# Patient Record
Sex: Female | Born: 1935 | Race: White | Hispanic: No | Smoking: Never smoker
Health system: Southern US, Community
[De-identification: ages and names within clinical notes are randomized; demographics above are authoritative.]

## PROBLEM LIST (undated history)

## (undated) DIAGNOSIS — M1990 Unspecified osteoarthritis, unspecified site: Secondary | ICD-10-CM

## (undated) DIAGNOSIS — B029 Zoster without complications: Secondary | ICD-10-CM

## (undated) DIAGNOSIS — M19049 Primary osteoarthritis, unspecified hand: Secondary | ICD-10-CM

## (undated) DIAGNOSIS — Z85828 Personal history of other malignant neoplasm of skin: Secondary | ICD-10-CM

## (undated) DIAGNOSIS — J309 Allergic rhinitis, unspecified: Secondary | ICD-10-CM

## (undated) DIAGNOSIS — R490 Dysphonia: Secondary | ICD-10-CM

## (undated) DIAGNOSIS — R011 Cardiac murmur, unspecified: Secondary | ICD-10-CM

## (undated) DIAGNOSIS — K5790 Diverticulosis of intestine, part unspecified, without perforation or abscess without bleeding: Secondary | ICD-10-CM

## (undated) DIAGNOSIS — M810 Age-related osteoporosis without current pathological fracture: Secondary | ICD-10-CM

## (undated) DIAGNOSIS — E785 Hyperlipidemia, unspecified: Secondary | ICD-10-CM

## (undated) DIAGNOSIS — K5730 Diverticulosis of large intestine without perforation or abscess without bleeding: Secondary | ICD-10-CM

## (undated) DIAGNOSIS — C4490 Unspecified malignant neoplasm of skin, unspecified: Secondary | ICD-10-CM

## (undated) HISTORY — PX: MOHS SURGERY: SUR867

## (undated) HISTORY — DX: Zoster without complications: B02.9

## (undated) HISTORY — DX: Diverticulosis of large intestine without perforation or abscess without bleeding: K57.30

## (undated) HISTORY — DX: Hyperlipidemia, unspecified: E78.5

## (undated) HISTORY — DX: Personal history of other malignant neoplasm of skin: Z85.828

## (undated) HISTORY — PX: CATARACT EXTRACTION, BILATERAL: SHX1313

## (undated) HISTORY — DX: Unspecified malignant neoplasm of skin, unspecified: C44.90

## (undated) HISTORY — DX: Age-related osteoporosis without current pathological fracture: M81.0

## (undated) HISTORY — DX: Primary osteoarthritis, unspecified hand: M19.049

## (undated) HISTORY — DX: Allergic rhinitis, unspecified: J30.9

## (undated) HISTORY — DX: Diverticulosis of intestine, part unspecified, without perforation or abscess without bleeding: K57.90

## (undated) HISTORY — DX: Unspecified osteoarthritis, unspecified site: M19.90

## (undated) HISTORY — DX: Dysphonia: R49.0

## (undated) HISTORY — DX: Cardiac murmur, unspecified: R01.1

---

## 1978-09-01 DIAGNOSIS — C4491 Basal cell carcinoma of skin, unspecified: Secondary | ICD-10-CM

## 1978-09-01 HISTORY — DX: Basal cell carcinoma of skin, unspecified: C44.91

## 1988-06-23 HISTORY — PX: VAGINAL HYSTERECTOMY: SUR661

## 1998-02-22 ENCOUNTER — Ambulatory Visit (HOSPITAL_COMMUNITY): Admission: RE | Admit: 1998-02-22 | Discharge: 1998-02-22 | Payer: Self-pay | Admitting: Family Medicine

## 1998-11-22 ENCOUNTER — Other Ambulatory Visit: Admission: RE | Admit: 1998-11-22 | Discharge: 1998-11-22 | Payer: Self-pay | Admitting: *Deleted

## 1999-03-12 ENCOUNTER — Ambulatory Visit (HOSPITAL_COMMUNITY): Admission: RE | Admit: 1999-03-12 | Discharge: 1999-03-12 | Payer: Self-pay | Admitting: *Deleted

## 1999-03-12 ENCOUNTER — Encounter: Payer: Self-pay | Admitting: *Deleted

## 1999-05-24 ENCOUNTER — Encounter: Admission: RE | Admit: 1999-05-24 | Discharge: 1999-05-24 | Payer: Self-pay | Admitting: Family Medicine

## 1999-05-24 ENCOUNTER — Encounter: Payer: Self-pay | Admitting: Family Medicine

## 2000-01-14 ENCOUNTER — Other Ambulatory Visit: Admission: RE | Admit: 2000-01-14 | Discharge: 2000-01-14 | Payer: Self-pay | Admitting: *Deleted

## 2000-05-11 ENCOUNTER — Encounter: Payer: Self-pay | Admitting: *Deleted

## 2000-05-11 ENCOUNTER — Ambulatory Visit (HOSPITAL_COMMUNITY): Admission: RE | Admit: 2000-05-11 | Discharge: 2000-05-11 | Payer: Self-pay | Admitting: *Deleted

## 2001-02-23 ENCOUNTER — Other Ambulatory Visit: Admission: RE | Admit: 2001-02-23 | Discharge: 2001-02-23 | Payer: Self-pay | Admitting: *Deleted

## 2001-05-13 ENCOUNTER — Ambulatory Visit (HOSPITAL_COMMUNITY): Admission: RE | Admit: 2001-05-13 | Discharge: 2001-05-13 | Payer: Self-pay | Admitting: *Deleted

## 2001-05-13 ENCOUNTER — Encounter: Payer: Self-pay | Admitting: *Deleted

## 2001-05-19 ENCOUNTER — Encounter: Admission: RE | Admit: 2001-05-19 | Discharge: 2001-05-19 | Payer: Self-pay | Admitting: *Deleted

## 2001-05-19 ENCOUNTER — Encounter: Payer: Self-pay | Admitting: *Deleted

## 2002-07-28 ENCOUNTER — Ambulatory Visit (HOSPITAL_COMMUNITY): Admission: RE | Admit: 2002-07-28 | Discharge: 2002-07-28 | Payer: Self-pay | Admitting: Internal Medicine

## 2002-07-28 ENCOUNTER — Encounter: Payer: Self-pay | Admitting: Internal Medicine

## 2003-07-31 ENCOUNTER — Ambulatory Visit (HOSPITAL_COMMUNITY): Admission: RE | Admit: 2003-07-31 | Discharge: 2003-07-31 | Payer: Self-pay | Admitting: Internal Medicine

## 2004-02-07 ENCOUNTER — Encounter: Admission: RE | Admit: 2004-02-07 | Discharge: 2004-02-07 | Payer: Self-pay | Admitting: Internal Medicine

## 2005-03-04 ENCOUNTER — Ambulatory Visit (HOSPITAL_COMMUNITY): Admission: RE | Admit: 2005-03-04 | Discharge: 2005-03-04 | Payer: Self-pay | Admitting: Internal Medicine

## 2005-04-18 ENCOUNTER — Ambulatory Visit: Payer: Self-pay | Admitting: Internal Medicine

## 2005-04-21 ENCOUNTER — Ambulatory Visit: Payer: Self-pay | Admitting: Internal Medicine

## 2005-05-30 ENCOUNTER — Ambulatory Visit: Payer: Self-pay | Admitting: Family Medicine

## 2006-03-26 ENCOUNTER — Ambulatory Visit (HOSPITAL_COMMUNITY): Admission: RE | Admit: 2006-03-26 | Discharge: 2006-03-26 | Payer: Self-pay | Admitting: Internal Medicine

## 2006-05-25 ENCOUNTER — Ambulatory Visit: Payer: Self-pay | Admitting: Internal Medicine

## 2006-08-01 ENCOUNTER — Ambulatory Visit: Payer: Self-pay | Admitting: Family Medicine

## 2006-09-01 ENCOUNTER — Ambulatory Visit: Payer: Self-pay | Admitting: Internal Medicine

## 2006-09-01 LAB — CONVERTED CEMR LAB
ALT: 19 units/L (ref 0–40)
Alkaline Phosphatase: 67 units/L (ref 39–117)
BUN: 21 mg/dL (ref 6–23)
Basophils Relative: 0.3 % (ref 0.0–1.0)
Bilirubin, Direct: 0.1 mg/dL (ref 0.0–0.3)
CO2: 30 meq/L (ref 19–32)
Cholesterol: 243 mg/dL (ref 0–200)
Direct LDL: 139.4 mg/dL
Eosinophils Relative: 1.4 % (ref 0.0–5.0)
GFR calc non Af Amer: 88 mL/min
Glucose, Bld: 94 mg/dL (ref 70–99)
HCT: 38.9 % (ref 36.0–46.0)
Hemoglobin: 13.2 g/dL (ref 12.0–15.0)
Ketones, ur: NEGATIVE mg/dL
MCHC: 34 g/dL (ref 30.0–36.0)
Monocytes Relative: 9 % (ref 3.0–11.0)
Neutrophils Relative %: 58.5 % (ref 43.0–77.0)
RBC: 4.22 M/uL (ref 3.87–5.11)
RDW: 14.2 % (ref 11.5–14.6)
Specific Gravity, Urine: 1.01 (ref 1.000–1.03)
TSH: 0.96 microintl units/mL (ref 0.35–5.50)
Total Protein, Urine: NEGATIVE mg/dL
Urine Glucose: NEGATIVE mg/dL
Vit D, 1,25-Dihydroxy: 53 (ref 20–57)
pH: 6 (ref 5.0–8.0)

## 2007-01-22 ENCOUNTER — Ambulatory Visit: Payer: Self-pay | Admitting: Internal Medicine

## 2007-01-23 DIAGNOSIS — K5730 Diverticulosis of large intestine without perforation or abscess without bleeding: Secondary | ICD-10-CM

## 2007-01-23 DIAGNOSIS — Z85828 Personal history of other malignant neoplasm of skin: Secondary | ICD-10-CM

## 2007-01-23 DIAGNOSIS — M19049 Primary osteoarthritis, unspecified hand: Secondary | ICD-10-CM

## 2007-01-23 DIAGNOSIS — Z8679 Personal history of other diseases of the circulatory system: Secondary | ICD-10-CM | POA: Insufficient documentation

## 2007-01-23 DIAGNOSIS — E785 Hyperlipidemia, unspecified: Secondary | ICD-10-CM

## 2007-01-23 HISTORY — DX: Primary osteoarthritis, unspecified hand: M19.049

## 2007-01-23 HISTORY — DX: Hyperlipidemia, unspecified: E78.5

## 2007-01-23 HISTORY — DX: Personal history of other malignant neoplasm of skin: Z85.828

## 2007-01-23 HISTORY — DX: Diverticulosis of large intestine without perforation or abscess without bleeding: K57.30

## 2007-02-08 ENCOUNTER — Ambulatory Visit: Payer: Self-pay | Admitting: Internal Medicine

## 2007-02-08 LAB — CONVERTED CEMR LAB
Alkaline Phosphatase: 69 units/L (ref 39–117)
BUN: 13 mg/dL (ref 6–23)
Basophils Absolute: 0.5 10*3/uL — ABNORMAL HIGH (ref 0.0–0.1)
Bilirubin, Direct: 0.1 mg/dL (ref 0.0–0.3)
Chloride: 101 meq/L (ref 96–112)
Creatinine, Ser: 0.8 mg/dL (ref 0.4–1.2)
GFR calc non Af Amer: 76 mL/min
Glucose, Bld: 105 mg/dL — ABNORMAL HIGH (ref 70–99)
Hemoglobin: 14.3 g/dL (ref 12.0–15.0)
Lymphocytes Relative: 13.1 % (ref 12.0–46.0)
Monocytes Relative: 5.5 % (ref 3.0–11.0)
Neutrophils Relative %: 76.5 % (ref 43.0–77.0)
Platelets: 272 10*3/uL (ref 150–400)
Total Protein: 7 g/dL (ref 6.0–8.3)
WBC: 11.2 10*3/uL — ABNORMAL HIGH (ref 4.5–10.5)

## 2007-03-11 ENCOUNTER — Ambulatory Visit: Payer: Self-pay | Admitting: Internal Medicine

## 2007-05-10 ENCOUNTER — Ambulatory Visit: Payer: Self-pay | Admitting: Internal Medicine

## 2007-05-17 ENCOUNTER — Ambulatory Visit: Payer: Self-pay | Admitting: Internal Medicine

## 2007-05-17 LAB — PULMONARY FUNCTION TEST

## 2007-06-01 ENCOUNTER — Ambulatory Visit (HOSPITAL_COMMUNITY): Admission: RE | Admit: 2007-06-01 | Discharge: 2007-06-01 | Payer: Self-pay | Admitting: Internal Medicine

## 2007-06-14 ENCOUNTER — Ambulatory Visit: Payer: Self-pay | Admitting: Family Medicine

## 2007-06-14 ENCOUNTER — Encounter: Payer: Self-pay | Admitting: Internal Medicine

## 2007-08-09 ENCOUNTER — Ambulatory Visit: Payer: Self-pay | Admitting: Internal Medicine

## 2007-08-09 DIAGNOSIS — J40 Bronchitis, not specified as acute or chronic: Secondary | ICD-10-CM | POA: Insufficient documentation

## 2007-09-28 ENCOUNTER — Telehealth: Payer: Self-pay | Admitting: Internal Medicine

## 2007-10-29 ENCOUNTER — Ambulatory Visit: Payer: Self-pay | Admitting: Internal Medicine

## 2007-10-29 DIAGNOSIS — J309 Allergic rhinitis, unspecified: Secondary | ICD-10-CM

## 2007-10-29 DIAGNOSIS — M810 Age-related osteoporosis without current pathological fracture: Secondary | ICD-10-CM

## 2007-10-29 HISTORY — DX: Age-related osteoporosis without current pathological fracture: M81.0

## 2007-10-29 HISTORY — DX: Allergic rhinitis, unspecified: J30.9

## 2007-11-05 ENCOUNTER — Telehealth: Payer: Self-pay | Admitting: Internal Medicine

## 2008-02-15 ENCOUNTER — Ambulatory Visit: Payer: Self-pay | Admitting: Internal Medicine

## 2008-02-18 LAB — CONVERTED CEMR LAB
ALT: 16 units/L (ref 0–35)
AST: 46 units/L — ABNORMAL HIGH (ref 0–37)
Bilirubin, Direct: 0.2 mg/dL (ref 0.0–0.3)
Total CHOL/HDL Ratio: 3.1

## 2008-03-16 ENCOUNTER — Telehealth (INDEPENDENT_AMBULATORY_CARE_PROVIDER_SITE_OTHER): Payer: Self-pay | Admitting: *Deleted

## 2008-03-23 ENCOUNTER — Encounter: Payer: Self-pay | Admitting: Internal Medicine

## 2008-04-12 ENCOUNTER — Ambulatory Visit: Payer: Self-pay | Admitting: Internal Medicine

## 2008-04-12 DIAGNOSIS — B029 Zoster without complications: Secondary | ICD-10-CM | POA: Insufficient documentation

## 2008-04-17 ENCOUNTER — Telehealth (INDEPENDENT_AMBULATORY_CARE_PROVIDER_SITE_OTHER): Payer: Self-pay | Admitting: *Deleted

## 2008-04-24 ENCOUNTER — Telehealth (INDEPENDENT_AMBULATORY_CARE_PROVIDER_SITE_OTHER): Payer: Self-pay | Admitting: *Deleted

## 2008-05-02 ENCOUNTER — Telehealth (INDEPENDENT_AMBULATORY_CARE_PROVIDER_SITE_OTHER): Payer: Self-pay | Admitting: *Deleted

## 2008-05-10 ENCOUNTER — Ambulatory Visit: Payer: Self-pay | Admitting: Internal Medicine

## 2008-05-10 DIAGNOSIS — H538 Other visual disturbances: Secondary | ICD-10-CM

## 2008-05-12 ENCOUNTER — Ambulatory Visit: Payer: Self-pay | Admitting: Internal Medicine

## 2008-05-29 ENCOUNTER — Ambulatory Visit: Payer: Self-pay | Admitting: Internal Medicine

## 2008-06-14 ENCOUNTER — Ambulatory Visit: Payer: Self-pay | Admitting: Internal Medicine

## 2008-06-14 LAB — CONVERTED CEMR LAB
AST: 51 units/L — ABNORMAL HIGH (ref 0–37)
Albumin: 4 g/dL (ref 3.5–5.2)
Triglycerides: 68 mg/dL (ref 0–149)
VLDL: 14 mg/dL (ref 0–40)

## 2008-06-20 ENCOUNTER — Telehealth: Payer: Self-pay | Admitting: Internal Medicine

## 2008-07-11 ENCOUNTER — Ambulatory Visit (HOSPITAL_COMMUNITY): Admission: RE | Admit: 2008-07-11 | Discharge: 2008-07-11 | Payer: Self-pay | Admitting: Internal Medicine

## 2008-09-22 ENCOUNTER — Telehealth: Payer: Self-pay | Admitting: Internal Medicine

## 2009-04-10 ENCOUNTER — Telehealth: Payer: Self-pay | Admitting: Internal Medicine

## 2009-04-12 ENCOUNTER — Telehealth: Payer: Self-pay | Admitting: Internal Medicine

## 2009-06-23 HISTORY — PX: FOOT SURGERY: SHX648

## 2009-07-23 ENCOUNTER — Ambulatory Visit: Payer: Self-pay | Admitting: Internal Medicine

## 2009-07-23 ENCOUNTER — Encounter: Payer: Self-pay | Admitting: Internal Medicine

## 2009-07-30 ENCOUNTER — Ambulatory Visit: Payer: Self-pay | Admitting: Internal Medicine

## 2009-07-31 ENCOUNTER — Ambulatory Visit (HOSPITAL_COMMUNITY): Admission: RE | Admit: 2009-07-31 | Discharge: 2009-07-31 | Payer: Self-pay | Admitting: Internal Medicine

## 2009-09-10 ENCOUNTER — Ambulatory Visit: Payer: Self-pay | Admitting: Internal Medicine

## 2010-07-21 LAB — CONVERTED CEMR LAB
ALT: 23 units/L (ref 0–35)
BUN: 16 mg/dL (ref 6–23)
Basophils Absolute: 0 10*3/uL (ref 0.0–0.1)
Bilirubin Urine: NEGATIVE
Bilirubin, Direct: 0.1 mg/dL (ref 0.0–0.3)
Calcium: 10.3 mg/dL (ref 8.4–10.5)
Calcium: 9.6 mg/dL (ref 8.4–10.5)
Chloride: 103 meq/L (ref 96–112)
Cholesterol: 180 mg/dL (ref 0–200)
Cholesterol: 237 mg/dL (ref 0–200)
Creatinine, Ser: 0.8 mg/dL (ref 0.4–1.2)
Direct LDL: 152.3 mg/dL
GFR calc non Af Amer: 75 mL/min
HCT: 42.9 % (ref 36.0–46.0)
HDL: 68.9 mg/dL (ref >39.0)
HDL: 83.8 mg/dL (ref >39.00)
Hemoglobin: 14 g/dL (ref 12.0–15.0)
Ketones, ur: NEGATIVE mg/dL
Ketones, ur: NEGATIVE mg/dL
Lymphs Abs: 1.4 10*3/uL (ref 0.7–4.0)
MCHC: 32.6 g/dL (ref 30.0–36.0)
Monocytes Absolute: 0.5 10*3/uL (ref 0.1–1.0)
Neutrophils Relative %: 59.7 % (ref 43.0–77.0)
Nitrite: NEGATIVE
Platelets: 194 10*3/uL (ref 150.0–400.0)
Potassium: 3.9 meq/L (ref 3.5–5.1)
Potassium: 4.4 meq/L (ref 3.5–5.1)
RBC: 4.41 M/uL (ref 3.87–5.11)
RBC: 4.63 M/uL (ref 3.87–5.11)
Sodium: 141 meq/L (ref 135–145)
Sodium: 144 meq/L (ref 135–145)
TSH: 0.95 microintl units/mL (ref 0.35–5.50)
Total Bilirubin: 0.6 mg/dL (ref 0.3–1.2)
Total CHOL/HDL Ratio: 2
Total Protein, Urine: NEGATIVE mg/dL
Total Protein, Urine: NEGATIVE mg/dL
Total Protein: 7.1 g/dL (ref 6.0–8.3)
Total Protein: 7.2 g/dL (ref 6.0–8.3)
Urine Glucose: NEGATIVE mg/dL
Urine Glucose: NEGATIVE mg/dL
Urobilinogen, UA: 0.2 (ref 0.0–1.0)
Urobilinogen, UA: 0.2 (ref 0.0–1.0)
VLDL: 16.6 mg/dL (ref 0.0–40.0)
VLDL: 19 mg/dL (ref 0–40)
Vit D, 25-Hydroxy: 48 ng/mL (ref 30–89)
WBC: 5 10*3/uL (ref 4.5–10.5)

## 2010-07-23 NOTE — Miscellaneous (Signed)
Summary: BONE DENSITY  Clinical Lists Changes  Orders: Added new Test order of T-Bone Densitometry (77080) - Signed Added new Test order of T-Lumbar Vertebral Assessment (77082) - Signed 

## 2010-07-23 NOTE — Assessment & Plan Note (Signed)
Summary: YEARLY FU/ MEDICARE/ LABS SAME DAY /NWS  #   Vital Signs:  Patient profile:   75 year old female Height:      67 inches Weight:      117.50 pounds BMI:     18.47 O2 Sat:      98 % on Room air Temp:     97 degrees F oral Pulse rate:   71 / minute BP sitting:   122 / 72  (left arm) Cuff size:   regular  Vitals Entered ByZella Ball Ewing (September 10, 2009 10:56 AM)  O2 Flow:  Room air  Preventive Care Screening  Bone Density:    Date:  06/23/2009    Next Due:  06/2011    Results:  abnormal std dev  Last Pneumovax:    Date:  09/10/2009    Results:  Pneumovax  Last Tetanus Booster:    Date:  09/10/2009    Results:  Td  CC: Adult Physical/RE   Primary Care Provider:  Excell Seltzer  CC:  Adult Physical/RE.  History of Present Illness: on lipitor instead of the zocor after she waw a study regardin neg immune effects;  not able to be as acitve as before recntely due to recent left foot pain in rehab after the recent 2011 surgury;  podiatrist asked her to meniton here she may have some indicatoin for weakned bones;  postpop course bunionetomy complicated by plantar fasciitis;  also menitons has cyst to the post head and has appt with derm later today to assess;  not sleeping well since it hurts to lie at night;  no fever or drainage.  no taking fish oil. Pt denies CP, sob, doe, wheezing, orthopnea, pnd, worsening LE edema, palps, dizziness or syncope  Pt denies new neuro symptoms such as headache, facial or extremity weakness     Problems Prior to Update: 1)  Blurred Vision  (ICD-368.8) 2)  Shingles  (ICD-053.9) 3)  Preventive Health Care  (ICD-V70.0) 4)  Allergic Rhinitis  (ICD-477.9) 5)  Osteoporosis  (ICD-733.00) 6)  Bronchitis  (ICD-490) 7)  Hyperlipidemia  (ICD-272.4) 8)  Cardiac Murmur, Hx of  (ICD-V12.50) 9)  Diverticulosis, Colon  (ICD-562.10) 10)  Degenerative Joint Disease, Hands  (ICD-715.94) 11)  Skin Cancer, Hx of  (ICD-V10.83)  Medications Prior to  Update: 1)  Actonel 150 Mg  Tabs (Risedronate Sodium) .Marland Kitchen.. 1 By Mouth Q Mo 2)  Multivitamins   Tabs (Multiple Vitamin) .... Take 1 Tablet By Mouth Once A Day 3)  Os-Cal Ultra 600 Mg  Tabs (Calcium Carb-Vit D-C-E-Mineral) .... Take 1 By Mouth Once Daily 4)  Lipitor 20 Mg Tabs (Atorvastatin Calcium) .Marland Kitchen.. 1 By Mouth Once Daily 5)  Allegra 180 Mg Tabs (Fexofenadine Hcl) .Marland Kitchen.. 1 By Mouth Once Daily 6)  Fish Oil 1000 Mg Caps (Omega-3 Fatty Acids) .... Take 1 By Mouth Once Daily 7)  Cimzia 2 X 200 Mg Kit (Certolizumab Pegol) .... Mix With Grape Juice Daily  Current Medications (verified): 1)  Actonel 150 Mg  Tabs (Risedronate Sodium) .Marland Kitchen.. 1 By Mouth Q Mo 2)  Multivitamins   Tabs (Multiple Vitamin) .... Take 1 Tablet By Mouth Once A Day 3)  Os-Cal Ultra 600 Mg  Tabs (Calcium Carb-Vit D-C-E-Mineral) .... Take 1 By Mouth Once Daily 4)  Lipitor 20 Mg Tabs (Atorvastatin Calcium) .Marland Kitchen.. 1 By Mouth Once Daily 5)  Allegra 180 Mg Tabs (Fexofenadine Hcl) .Marland Kitchen.. 1 By Mouth Once Daily 6)  Fish Oil 1000 Mg Caps (Omega-3 Fatty Acids) .Marland KitchenMarland KitchenMarland Kitchen  Take 1 By Mouth Once Daily 7)  Cimzia 2 X 200 Mg Kit (Certolizumab Pegol) .... Mix With Grape Juice Daily  Allergies (verified): 1)  ! Pcn 2)  ! Sulfa 3)  ! 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  Risk Factors: Smoking Status: never (10/29/2007)  Past Surgical History: Hysterectomy- 1990 MOH's surgery - nose s/p bilat cataract s/p left Foot surgery 2011, bunion iwth prolonged recovery  Review of Systems  The  patient denies anorexia, fever, weight loss, weight gain, vision loss, decreased hearing, hoarseness, chest pain, syncope, dyspnea on exertion, peripheral edema, prolonged cough, headaches, hemoptysis, abdominal pain, melena, hematochezia, severe indigestion/heartburn, hematuria, incontinence, suspicious skin lesions, transient blindness, difficulty walking, depression, unusual weight change, abnormal bleeding, enlarged lymph nodes, and angioedema.         all otherwise negative per pt -    Physical Exam  General:  alert and well-developed.   Head:  normocephalic and atraumatic.   Eyes:  vision grossly intact, pupils equal, and pupils round.   Ears:  R ear normal and L ear normal.   Nose:  no external deformity and no nasal discharge.   Mouth:  no gingival abnormalities and pharynx pink and moist.   Neck:  supple and no masses.   Lungs:  normal respiratory effort and normal breath sounds.   Heart:  normal rate and regular rhythm.   Abdomen:  soft, non-tender, and normal bowel sounds.   Msk:  no joint tenderness and no joint swelling.   Extremities:  no edema, no erythema  Neurologic:  cranial nerves II-XII intact and strength normal in all extremities.     Impression & Recommendations:  Problem # 1:  Preventive Health Care (ICD-V70.0)  Overall doing well, age appropriate education and counseling updated and referral for appropriate preventive services done unless declined, immunizations up to date or declined, diet counseling done if overweight, urged to quit smoking if smokes , most recent labs reviewed and current ordered if appropriate, ecg reviewed or declined (interpretation per ECG scanned in the EMR if done); information regarding Medicare Prevention requirements given if appropriate   Orders: T-Vitamin D (25-Hydroxy) (16109-60454) TLB-BMP (Basic Metabolic Panel-BMET) (80048-METABOL) TLB-CBC Platelet - w/Differential (85025-CBCD) TLB-Hepatic/Liver Function Pnl  (80076-HEPATIC) TLB-Lipid Panel (80061-LIPID) TLB-TSH (Thyroid Stimulating Hormone) (84443-TSH) TLB-Udip ONLY (81003-UDIP)  Complete Medication List: 1)  Actonel 150 Mg Tabs (Risedronate sodium) .Marland Kitchen.. 1 by mouth q mo 2)  Multivitamins Tabs (Multiple vitamin) .... Take 1 tablet by mouth once a day 3)  Os-cal Ultra 600 Mg Tabs (Calcium carb-vit d-c-e-mineral) .... Take 1 by mouth once daily 4)  Lipitor 20 Mg Tabs (Atorvastatin calcium) .Marland Kitchen.. 1 by mouth once daily 5)  Allegra 180 Mg Tabs (Fexofenadine hcl) .Marland Kitchen.. 1 by mouth once daily 6)  Fish Oil 1000 Mg Caps (Omega-3 fatty acids) .... Take 1 by mouth once daily 7)  Cimzia 2 X 200 Mg Kit (Certolizumab pegol) .... Mix with grape juice daily  Other Orders: TD Toxoids IM 7 YR + (09811) Pneumococcal Vaccine (91478) Admin 1st Vaccine (29562) Admin of Any Addtl Vaccine (13086)  Patient Instructions: 1)  Please take all new medications as prescribed  - the allegra 2)  Continue all previous medications as before this visit  3)  Please go to the Lab in the basement for your blood and/or urine tests today 4)  You had the tetanus and pneumonia shots today 5)  Please schedule a follow-up appointment in 1 year with CPX labs Prescriptions: ALLEGRA 180 MG TABS (FEXOFENADINE HCL) 1 by mouth once daily  #90 x 3   Entered and Authorized by:   Corwin Levins MD   Signed by:   Corwin Levins MD on 09/10/2009   Method used:   Print then Give to Patient   RxID:   [id number] LIPITOR 20 MG TABS (ATORVASTATIN CALCIUM) 1 by mouth once daily  #90 x 3   Entered and Authorized by:   Corwin Levins MD   Signed by:   Corwin Levins MD on 09/10/2009   Method used:   Print then Give to Patient   RxID:   [id number] ACTONEL 150 MG  TABS (RISEDRONATE SODIUM) 1 by mouth q mo  #3 x 3   Entered and Authorized by:   Corwin Levins MD   Signed by:   Corwin Levins MD on 09/10/2009   Method used:   Print then Give to Patient   RxID:   [id number]    Immunizations  Administered:  Tetanus Vaccine:    Vaccine Type: Td    Site: left deltoid    Mfr: GlaxoSmithKline    Dose: 0.5 ml    Route: IM    Given by: Zella Ball Ewing    Exp. Date: 05/08/2011    Lot #: [id number]    VIS given: 05/11/07 version given September 10, 2009.  Pneumonia Vaccine:    Vaccine Type: Pneumovax    Site: right deltoid    Mfr: Merck    Dose: 0.5 ml    Route: IM    Given by: Robin Ewing    Exp. Date: 02/04/2011    Lot #: [id number]    VIS given: 01/19/96 version given September 10, 2009.

## 2010-07-23 NOTE — Assessment & Plan Note (Signed)
Summary: FOLLOW UP/ MBW   Primary Provider/Referring Provider:  Excell Seltzer  CC:  Follow up visit.  History of Present Illness: 08/09/07-  She has completely resolved the bronchitis she got last summer after catching a cold from grandchild. 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 She has noticed some decline in hearing and iIsuggested ENT.  Normal PFT 11/08.  July 30, 2009- Allergic rhinitis, bronchitis Her grown son in living with her temporarily and she is worried about him. He smokes and coughs-  She blames that for her high BP today. Has had pneumonia twice in past 2 years. Has had pneumovax. Had flu vax. She had an article suggesting impaired immune resistance on simvastatin, which has been replaced with Lipitor. She had repeated pneumonias as a Desty[place] String[place] child. Father was heavy smoker. Sister had croup then. Denies routine cough, rattle or wheeze now. She can sleep on sides without breathing problems. Had a bad cold in October which responded well  to Z pak.    Current Medications (verified): 1)  Actonel 150 Mg  Tabs (Risedronate Sodium) .Marland Kitchen.. 1 By Mouth Q Mo 2)  Multivitamins   Tabs (Multiple Vitamin) .... Take 1 Tablet By Mouth Once A Day 3)  Os-Cal Ultra 600 Mg  Tabs (Calcium Carb-Vit D-C-E-Mineral) .... Take 1 By Mouth Once Daily 4)  Lipitor 20 Mg Tabs (Atorvastatin Calcium) .Marland Kitchen.. 1 By Mouth Once Daily 5)  Allegra 180 Mg Tabs (Fexofenadine Hcl) .Marland Kitchen.. 1 By Mouth Once Daily 6)  Fish Oil 1000 Mg Caps (Omega-3 Fatty Acids) .... Take 1 By Mouth Once Daily 7)  Cimzia 2 X 200 Mg Kit (Certolizumab Pegol) .... Mix With Grape Juice Daily  Allergies: 1)  ! Pcn 2)  ! Sulfa 3)  ! 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    Vital Signs:  Patient profile:   75 year old female Height:      67 inches Weight:      120.25 pounds BMI:     18.90 O2 Sat:      99 % on Room air Pulse rate:   97 / minute BP sitting:   160 / 84  (left arm) Cuff size:   regular  Vitals Entered By: Reynaldo Minium CMA (July 30, 2009 9:56 AM)  O2 Flow:  Room air  Physical Exam  Additional Exam:  General: A/Ox3; pleasant and cooperative, NAD, slender, looks younger than stated age SKIN: no rash, lesions NODES: no lymphadenopathy HEENT: [place] I/AT, EOM- WNL, Conjuctivae- clear, PERRLA, TM-WNL, Nose- clear, Throat- clear and wnl NECK: Supple w/ fair ROM, JVD- none, normal carotid impulses w/o bruits Thyroid-  CHEST: Clear to P&A, no wheeze or rhonchi HEART:  RRR, no m/g/r heard ABDOMEN: Soft and nl; OZH:YQMV, nl pulses, no edema  NEURO: Grossly intact to observation      Impression & Recommendations:  Problem # 1:  ALLERGIC RHINITIS (ICD-477.9)  Her updated medication list for this problem includes:    Allegra 180 Mg Tabs (Fexofenadine hcl) .Marland Kitchen... 1 by mouth once daily  Problem # 2:  BRONCHITIS (ICD-490)  Hx of recurrent pneumonias, but other than a bronchitis last Fall she has done well. Smoking son is tempoorarily in her home. We reviewed her  vaccine status. She wants to maintain contact here. The following medications were removed from the medication list:    Singulair 10 Mg Tabs (Montelukast sodium) .Marland Kitchen... 1po once daily    Lodrane 12d 6-45 Mg Xr12h-tab (Brompheniramine-pseudoeph) .Marland Kitchen... Take 1 tablet by mouth two times a day as needed    Zithromax Z-pak 250 Mg Tabs (Azithromycin) .Marland Kitchen... Take 2 tablets today and then one tablet daily until gone.  Medications Added to Medication List This Visit: 1)  Fish Oil 1000 Mg Caps (Omega-3 fatty acids) .... Take 1 by mouth once daily 2)  Cimzia 2 X 200 Mg Kit (Certolizumab pegol) .... Mix with grape juice daily  Other Orders: Est. Patient Level III (78469)  Patient Instructions: 1)  Schedule return in one year, earlier if needed

## 2010-07-29 ENCOUNTER — Ambulatory Visit: Payer: Self-pay | Admitting: Internal Medicine

## 2010-08-13 ENCOUNTER — Other Ambulatory Visit: Payer: Self-pay | Admitting: Internal Medicine

## 2010-08-13 DIAGNOSIS — Z1231 Encounter for screening mammogram for malignant neoplasm of breast: Secondary | ICD-10-CM

## 2010-08-23 ENCOUNTER — Ambulatory Visit (HOSPITAL_COMMUNITY)
Admission: RE | Admit: 2010-08-23 | Discharge: 2010-08-23 | Disposition: A | Discharge status: Home / Self Care | Payer: MEDICARE | Source: Ambulatory Visit | Attending: Internal Medicine | Admitting: Internal Medicine

## 2010-08-23 DIAGNOSIS — Z1231 Encounter for screening mammogram for malignant neoplasm of breast: Secondary | ICD-10-CM | POA: Insufficient documentation

## 2010-09-08 ENCOUNTER — Telehealth: Payer: Self-pay | Admitting: Internal Medicine

## 2010-09-08 NOTE — Telephone Encounter (Signed)
error 

## 2010-09-09 ENCOUNTER — Other Ambulatory Visit: Payer: MEDICARE

## 2010-09-09 ENCOUNTER — Encounter: Payer: Self-pay | Admitting: Internal Medicine

## 2010-09-09 ENCOUNTER — Encounter (INDEPENDENT_AMBULATORY_CARE_PROVIDER_SITE_OTHER): Payer: Self-pay | Admitting: *Deleted

## 2010-09-09 ENCOUNTER — Other Ambulatory Visit: Payer: Self-pay | Admitting: Internal Medicine

## 2010-09-09 ENCOUNTER — Encounter (INDEPENDENT_AMBULATORY_CARE_PROVIDER_SITE_OTHER): Payer: MEDICARE | Admitting: Internal Medicine

## 2010-09-09 DIAGNOSIS — Z0000 Encounter for general adult medical examination without abnormal findings: Secondary | ICD-10-CM

## 2010-09-09 DIAGNOSIS — E785 Hyperlipidemia, unspecified: Secondary | ICD-10-CM

## 2010-09-09 DIAGNOSIS — R197 Diarrhea, unspecified: Secondary | ICD-10-CM | POA: Insufficient documentation

## 2010-09-09 DIAGNOSIS — R634 Abnormal weight loss: Secondary | ICD-10-CM | POA: Insufficient documentation

## 2010-09-09 LAB — HEPATIC FUNCTION PANEL
ALT: 18 U/L (ref 0–35)
AST: 51 U/L — ABNORMAL HIGH (ref 0–37)
Albumin: 4.6 g/dL (ref 3.5–5.2)
Bilirubin, Direct: 0.1 mg/dL (ref 0.0–0.3)
Total Protein: 7.1 g/dL (ref 6.0–8.3)

## 2010-09-09 LAB — CBC WITH DIFFERENTIAL/PLATELET
Eosinophils Absolute: 0.1 10*3/uL (ref 0.0–0.7)
Eosinophils Relative: 1 % (ref 0.0–5.0)
HCT: 44 % (ref 36.0–46.0)
Hemoglobin: 14.8 g/dL (ref 12.0–15.0)
Platelets: 231 10*3/uL (ref 150.0–400.0)
RBC: 4.65 Mil/uL (ref 3.87–5.11)
RDW: 15.5 % — ABNORMAL HIGH (ref 11.5–14.6)
WBC: 7 10*3/uL (ref 4.5–10.5)

## 2010-09-09 LAB — LIPID PANEL
Cholesterol: 249 mg/dL — ABNORMAL HIGH (ref 0–200)
VLDL: 19.6 mg/dL (ref 0.0–40.0)

## 2010-09-09 LAB — URINALYSIS
Bilirubin Urine: NEGATIVE
Hgb urine dipstick: NEGATIVE
Ketones, ur: NEGATIVE
Nitrite: NEGATIVE
Specific Gravity, Urine: 1.01 (ref 1.000–1.030)
Total Protein, Urine: NEGATIVE
Urine Glucose: NEGATIVE
pH: 7 (ref 5.0–8.0)

## 2010-09-09 LAB — BASIC METABOLIC PANEL: Chloride: 100 mEq/L (ref 96–112)

## 2010-09-19 NOTE — Assessment & Plan Note (Signed)
Summary: yearly medicare physical/lb   Vital Signs:  Patient profile:   75 year old female Height:      66 inches Weight:      109 pounds BMI:     17.66 O2 Sat:      98 % on Room air Temp:     97.6 degrees F oral Pulse rate:   71 / minute BP sitting:   120 / 76  (left arm) Cuff size:   regular  Vitals Entered By: Zella Ball Ewing CMA Duncan Dull) (September 09, 2010 8:49 AM)  O2 Flow:  Room air  Preventive Care Screening  Mammogram:    Date:  08/26/2010    Next Due:  08/2011    Results:  normal   CC: Yearly/RE   Primary Care Provider:  Excell Seltzer  CC:  Yearly/RE.  History of Present Illness: here for wellness and f/u;  Pt denies CP, worsening sob, doe, wheezing, orthopnea, pnd, worsening LE edema, palps, dizziness or syncope  Pt denies new neuro symptoms such as headache, facial or extremity weakness  Pt denies polydipsia, polyuria, or low sugar symptoms such as shakiness improved with eating.  Overall good compliance with meds, trying to follow low chol, DM diet, wt stable, does have occasioal excercise;  Overall good compliance with meds, and good tolerability, except not taking the lipitor - really trying to work on diet instead,  felt bad with gait problem on the lipitor and actualloy fell walking the dog (actually more than once oging down a hill).  Denies worsening depressive symptoms, suicidal ideation, or panic. though has some anxiety.   Pt states good ability with ADL's, low fall risk, home safety reviewed and adequate, no significant change in hearing or vision, trying to follow lower chol diet, and occasionally active only with regular excercise.   No fever, wt loss, night sweats, loss of appetite or other constitutional symptoms  Has had mult stressors recently - retirement finances not doing as well as expectd, and son went through rehab 2011.    Preventive Screening-Counseling & Management      Drug Use:  no.    Problems Prior to Update: 1)  Blurred Vision  (ICD-368.8) 2)   Shingles  (ICD-053.9) 3)  Preventive Health Care  (ICD-V70.0) 4)  Allergic Rhinitis  (ICD-477.9) 5)  Osteoporosis  (ICD-733.00) 6)  Bronchitis  (ICD-490) 7)  Hyperlipidemia  (ICD-272.4) 8)  Cardiac Murmur, Hx of  (ICD-V12.50) 9)  Diverticulosis, Colon  (ICD-562.10) 10)  Degenerative Joint Disease, Hands  (ICD-715.94) 11)  Skin Cancer, Hx of  (ICD-V10.83)  Medications Prior to Update: 1)  Actonel 150 Mg  Tabs (Risedronate Sodium) .Marland Kitchen.. 1 By Mouth Q Mo 2)  Multivitamins   Tabs (Multiple Vitamin) .... Take 1 Tablet By Mouth Once A Day 3)  Os-Cal Ultra 600 Mg  Tabs (Calcium Carb-Vit D-C-E-Mineral) .... Take 1 By Mouth Once Daily 4)  Lipitor 20 Mg Tabs (Atorvastatin Calcium) .Marland Kitchen.. 1 By Mouth Once Daily 5)  Allegra 180 Mg Tabs (Fexofenadine Hcl) .Marland Kitchen.. 1 By Mouth Once Daily 6)  Fish Oil 1000 Mg Caps (Omega-3 Fatty Acids) .... Take 1 By Mouth Once Daily 7)  Cimzia 2 X 200 Mg Kit (Certolizumab Pegol) .... Mix With Grape Juice Daily  Current Medications (verified): 1)  Multivitamins   Tabs (Multiple Vitamin) .... Take 1 Tablet By Mouth Once A Day 2)  Os-Cal Ultra 600 Mg  Tabs (Calcium Carb-Vit D-C-E-Mineral) .... Take 1 By Mouth Once Daily 3)  Allegra 180 Mg Tabs (  Fexofenadine Hcl) .Marland Kitchen.. 1 By Mouth Once Daily 4)  Fish Oil 1000 Mg Caps (Omega-3 Fatty Acids) .... Take 1 By Mouth Once Daily 5)  Cimzia 2 X 200 Mg Kit (Certolizumab Pegol) .... Mix With Grape Juice Daily  Allergies (verified): 1)  ! Pcn 2)  ! Sulfa 3)  ! 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 Drug use-no  Risk Factors: Smoking Status: never (10/29/2007)  Past Medical History: Skin cancer, hx of Hand Degenerative Joint Disease Diverticulosis, colon H/O Heart Murmur in Childhood Hyperlipidemia Osteoporosis Allergic rhinitis hx of shingles  Family History: Father was heavy smoker- died of CHF several in the family with RA - aunt x 2, and p-grandmother sister with cancer aon with substance abuse/narcotic/with rehab july 1011  Social History:  Never Smoked Alcohol use-yes Retired - Pharmacist, 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         all otherwise negative per pt -  except for some wt loss with incresed stressrs, as well ruecrring diarrhea of unclear etiology, and not takin the actonel as she  had recurrent jaw shooting pains and was concerned about the drug side effect, and tends to "choke" on dry food such as sandwich when does not have liquid to help  Physical Exam  General:  alert and well-developed.   Head:  normocephalic and atraumatic.   Eyes:  vision grossly intact, pupils equal, and pupils round.   Ears:  R ear normal and L ear normal.   Nose:  no external deformity and no nasal discharge.   Mouth:  no gingival abnormalities and pharynx pink and moist.   Neck:  supple and no masses.   Lungs:  normal respiratory effort and normal breath sounds.   Heart:  normal  rate and regular rhythm.   Abdomen:  soft, non-tender, and normal bowel sounds.   Msk:  no joint tenderness and no joint swelling.   Extremities:  no edema, no erythema  Neurologic:  cranial nerves II-XII intact and strength normal in all extremities.   Skin:  color normal and no rashes.   Psych:  not depressed appearing and slightly anxious.     Impression & Recommendations:  Problem # 1:  PREVENTIVE HEALTH CARE (ICD-V70.0)  Overall doing well, age appropriate education and counseling updated, referral for preventive services and immunizations addressed, dietary counseling and smoking status adressed , most recent labs reviewed, ecg reviewed I have personally reviewed and have noted 1.The patient's medical and social history 2.Their use of alcohol, tobacco or illicit drugs 3.Their current medications and supplements 4. Functional ability including ADL's, fall risk, home safety risk, hearing &  visual impairment  5.Diet and physical activities 6.Evidence for depression or mood disorders The patients weight, height, BMI  have been recorded in the chart I have made referrals, counseling and provided education to the patient based review of the above   Orders: TLB-BMP (Basic Metabolic Panel-BMET) (80048-METABOL) TLB-CBC Platelet - w/Differential (85025-CBCD) TLB-Hepatic/Liver Function Pnl (80076-HEPATIC) TLB-Lipid Panel (80061-LIPID) TLB-TSH (Thyroid Stimulating Hormone) (84443-TSH) TLB-Udip ONLY (81003-UDIP)  Problem # 2:  HYPERLIPIDEMIA (ICD-272.4)  The following medications were removed from the medication list:    Lipitor 20 Mg Tabs (Atorvastatin calcium) .Marland Kitchen... 1 by mouth once daily has been intolerant to 2 statins, does not want to try further for now; Pt to continue diet efforts, ; to check labs - goal LDL less than 100  Labs Reviewed: SGOT: 53 (09/10/2009)   SGPT: 23 (09/10/2009)   HDL:83.80 (09/10/2009), 77.3 (06/14/2008)  LDL:80 (09/10/2009), 71 (06/14/2008)  Chol:180  (09/10/2009), 162 (06/14/2008)  Trig:83.0 (09/10/2009), 68 (06/14/2008)  Problem # 3:  DIARRHEA (ICD-787.91)  uncelar etiology - ? IBS - for GI referral, is due for colonscopy at this time   Orders: Gastroenterology Referral (GI)  Problem # 4:  WEIGHT LOSS (ICD-783.21) ? emotional related.  declines tx at this time, delcines cxr   Complete Medication List: 1)  Multivitamins Tabs (Multiple vitamin) .... Take 1 tablet by mouth once a day 2)  Os-cal Ultra 600 Mg Tabs (Calcium carb-vit d-c-e-mineral) .... Take 1 by mouth once daily 3)  Allegra 180 Mg Tabs (Fexofenadine hcl) .Marland Kitchen.. 1 by mouth once daily 4)  Fish Oil 1000 Mg Caps (Omega-3 fatty acids) .... Take 1 by mouth once daily 5)  Cimzia 2 X 200 Mg Kit (Certolizumab pegol) .... Mix with grape juice daily  Patient Instructions: 1)  ok to stop the actonel and lipitor 2)  Continue all previous medications as before this visit 3)  You will be contacted about the referral(s) to: GI 4)  Please schedule a follow-up appointment in 1 year, or sooner if needed   Orders Added: 1)  TLB-BMP (Basic Metabolic Panel-BMET) [80048-METABOL] 2)  TLB-CBC Platelet - w/Differential [85025-CBCD] 3)  TLB-Hepatic/Liver Function Pnl [80076-HEPATIC] 4)  TLB-Lipid Panel [80061-LIPID] 5)  TLB-TSH (Thyroid Stimulating Hormone) [84443-TSH] 6)  TLB-Udip ONLY [81003-UDIP] 7)  Est. Patient 65& > [99397] 8)  Gastroenterology Referral [GI] 9)  Est. Patient 65& > [24401]

## 2010-09-19 NOTE — Letter (Signed)
Summary: New Patient letter  [facility]  [address] [place], [place] [address]   Phone: [phone]  Fax: [phone]       09/09/2010 MRN: [id number]  [facility] Candace Baldwin [address] [place], [place]  [address]  [place]  Dear Candace Baldwin,  Welcome to the Gastroenterology Division at [facility].    You are scheduled to see Dr.  Russella Dar on 10-17-10 at 11:30A.M. on the 3rd floor at [facility], [address]  We ask that you try to arrive at our office 15 minutes prior to your appointment time to allow for check-in.  We would like you to complete the enclosed self-administered evaluation form prior to your visit and bring it with you on the day of your appointment.  We will review it with you.  Also, please bring a complete list of all your medications or, if you prefer, bring the medication bottles and we will list them.  Please bring your insurance card so that we may make a copy of it.  If your insurance requires a referral to see a specialist, please bring your referral form from your primary care physician.  Co-payments are due at the time of your visit and may be paid by cash, check or credit card.     Your office visit will consist of a consult with your physician (includes a physical exam), any laboratory testing he/she may order, scheduling of any necessary diagnostic testing (e.g. x-ray, ultrasound, CT-scan), and scheduling of a procedure (e.g. Endoscopy, Colonoscopy) if required.  Please allow enough time on your schedule to allow for any/all of these possibilities.    If you cannot keep your appointment, please call [phone] to cancel or reschedule prior to your appointment date.  This allows [place] the opportunity to schedule an appointment for another patient in need of care.  If you do not cancel or reschedule by 5 p.m. the business day prior to your appointment date, you will be charged a $50.00 late cancellation/no-show fee.    Thank you for  choosing [facility] for your medical needs.  We appreciate the opportunity to care for you.  Please visit [place] at our website  to learn more about our practice.                     Sincerely,                                                             The Gastroenterology Division

## 2010-09-19 NOTE — Letter (Signed)
Summary: [facility] Consult Scheduled Letter  [facility]  [address] [place], [place] [address]   Phone: [phone]  Fax: [phone]      09/09/2010 MRN: [id number]  [facility] [place] [address] [place], [place]  [address]  [place]    Dear Ms. Jed Limerick,      We have scheduled an appointment for you.  At the recommendation of Dr.John, we have scheduled you a consult with Dr Russella Dar on 10/17/10 at 11:30am.  Their phone number is [phone].  If this appointment day and time is not convenient for you, please feel free to call the office of the doctor you are being referred to at the number listed above and reschedule the appointment.    [facility] [address] [place], [place] [address] *Gastroenterology Dept.3rd Floor*    Please give 24hr notice if you need to cancel/reschedule to avoid a $50.00 fee.Also bring insurance card and any co-pay due at time of visit.    Thank you,   Patient Care Coordinator [facility]

## 2010-10-17 ENCOUNTER — Ambulatory Visit: Payer: MEDICARE | Admitting: Gastroenterology

## 2010-10-22 ENCOUNTER — Ambulatory Visit (INDEPENDENT_AMBULATORY_CARE_PROVIDER_SITE_OTHER): Payer: Medicare Other | Admitting: Gastroenterology

## 2010-10-22 ENCOUNTER — Encounter: Payer: Self-pay | Admitting: Gastroenterology

## 2010-10-22 VITALS — BP 130/68 | HR 68 | Ht 67.0 in | Wt 109.6 lb

## 2010-10-22 DIAGNOSIS — R7402 Elevation of levels of lactic acid dehydrogenase (LDH): Secondary | ICD-10-CM

## 2010-10-22 DIAGNOSIS — R634 Abnormal weight loss: Secondary | ICD-10-CM

## 2010-10-22 DIAGNOSIS — K5730 Diverticulosis of large intestine without perforation or abscess without bleeding: Secondary | ICD-10-CM

## 2010-10-22 DIAGNOSIS — R197 Diarrhea, unspecified: Secondary | ICD-10-CM

## 2010-10-22 MED ORDER — PEG-KCL-NACL-NASULF-NA ASC-C 100 G PO SOLR: 1.0000 | Freq: Once | ORAL | Status: AC

## 2010-10-22 NOTE — Patient Instructions (Signed)
You have been scheduled for a Colonoscopy and separate instructions given. Pick up your prep from your pharmacy.  Cc: Oliver Barre, MD

## 2010-10-22 NOTE — Progress Notes (Signed)
History of Present Illness: This is a 75 year old female referred for evaluation of weight loss and diarrhea. She underwent colonoscopy by Dr. Victorino Dike in June 2003 which showed moderate diverticulosis. 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 She states her weight was around 122 pounds and she lost down to 104 pounds. Her weight has now increased to 109 and she feels her food intake and diet have been about the same the entire time. She has intermittent problems with watery, nonbloody diarrhea occurring up to 3 times a day and at other times she has normal bowel movements. Her recent blood work, including CBC, metabolic panel and TSH were unremarkable except for minimally elevated AST at 51. She has had a mildly elevated AST noted for the past few years with the remainder of the LFTs normal. She denies abdominal pain, change in appetite, chest pain, reflux symptoms, melena, hematochezia, constipation or change in stool caliber.   Past Medical History  Diagnosis Date  . Skin cancer   . DJD (degenerative joint disease)   . Diverticulosis   . Heart murmur   . Hyperlipidemia   . Osteoporosis   . Allergic rhinitis   . Shingles    Past Surgical History  Procedure Date  . Vaginal hysterectomy 1990  . Mohs surgery     Nose  . Cataract extraction, bilateral   . Foot surgery 2011    Bunion with prolonged recovery    reports that she has never smoked. She has never used smokeless tobacco. She reports that she drinks alcohol. She reports that she does not use illicit drugs. family history includes Heart failure in her father and Rheum arthritis in her paternal grandmother. Allergies  Allergen Reactions  . Atorvastatin     REACTION: gait problem  . Montelukast Sodium   . Penicillins   . Simvastatin    REACTION: gait problem  . Sulfonamide Derivatives    Outpatient Encounter Prescriptions as of 10/22/2010  Medication Sig Dispense Refill  . Calcium Carb-Vit D-C-E-Mineral (OS-CAL ULTRA) 600 MG TABS Take 1 tablet by mouth daily.        . Certolizumab Pegol (CIMZIA) 2 X 200 MG KIT Mix with grape juice daily       . fexofenadine (ALLEGRA) 180 MG tablet Take 180 mg by mouth daily.        . Multiple Vitamin (MULTIVITAMIN) tablet Take 1 tablet by mouth daily.        . Omega-3 Fatty Acids (FISH OIL) 1000 MG CAPS Take 1 capsule by mouth daily.        . Probiotic Product (PROBIOTIC & ACIDOPHILUS EX ST PO) Take 1 tablet by mouth daily.         Review of Systems: Pertinent positive and negative review of systems were noted in the above HPI section. All other review of systems were otherwise negative.  Physical Exam: General: Well developed , well nourished, no acute distress Head: Normocephalic and atraumatic Eyes:  sclerae anicteric, EOMI Ears: Normal auditory acuity Mouth: No deformity or lesions Neck: Supple, no masses or thyromegaly Lungs: Clear throughout to auscultation Heart: Regular rate and rhythm; no murmurs, rubs or bruits Abdomen: Soft, non tender and non distended. No masses, hepatosplenomegaly or hernias noted. Normal Bowel sounds Rectal: Deferred colonoscopy  Musculoskeletal: Symmetrical with no gross deformities  Skin: No lesions  on visible extremities Pulses:  Normal pulses noted Extremities: No clubbing, cyanosis, edema or deformities noted Neurological: Alert oriented x 4, grossly nonfocal Cervical Nodes:  No significant cervical adenopathy Inguinal Nodes: No significant inguinal adenopathy Psychological:  Alert and cooperative. Anxious.   Assessment and Recommendations:  1. Intermittent diarrhea and weight loss which appears to be correcting. Rule out inflammatory bowel disease, IBS, malabsorptive disorders and colorectal neoplasms. I think it is more likely that her  symptoms are  related to psychosocial stressors. The risks, benefits, and alternatives to colonoscopy with possible biopsy and possible polypectomy were discussed with the patient and they consent to proceed. She may use Imodium twice a day when necessary for diarrhea control. If her symptoms are not able to be controlled with management of her anxiety and stress I would proceed with further evaluation for celiac disease, bacterial overgrowth and other disorders.  2. Mildly elevated AST. This is likely an insignificant abnormality considering it has persisted for 4 years without other abnormal liver function tests however I would consider a full evaluation for underlying liver disease after her diarrhea and weight loss have been addressed. The source of the AST may be a non-liver source. Monitor LFTs per Dr. Jonny Ruiz.  3. Diverticulosis. Long-term high fiber diet with adequate daily water intake.  4. Anxiety.  Significant psychosocial stressors. Further management per Dr. Jonny Ruiz.

## 2010-10-23 ENCOUNTER — Encounter: Payer: Self-pay | Admitting: Gastroenterology

## 2010-11-05 NOTE — Assessment & Plan Note (Signed)
[facility]                             PULMONARY OFFICE NOTE   NAME:Candace Baldwin, Candace Baldwin                      MRN:          [id number]  DATE:05/10/2007                            DOB:          07-Jul-1935    SUBJECTIVE:  A 75 year old woman seen on kind referral from Dr. Corwin Levins, for her concerns of coughing, breathing and weight loss.   HISTORY:  She says her discomfort began this summer with a summer cold  which she caught visiting grandsons in [place].  She developed otitis  and bronchitis with a significant and productive cough.  She felt  poorly, lost weight and had a fever.  Antibiotics compounded her sense  of discomfort.  She was staying at her daughter's home in [place], where  mold was found, associated with air conditioning duct work, and she  slept in a room she says with her face directly under the vent from that  duct.  Standing water was found and remediation was performed on the  home.  She returned to [place] at the end of July.  By that time,  she had lost a lot of weight, ultimately about 14 pounds, and felt weak.  She is now eating to regain weight.  Her cough has significantly  improved.  It is no longer productive.  Generally she thinks she feels  much better.   She gives a history of several pneumonias as a child and has had chronic  chest x-ray changes, suggesting chronic obstructive pulmonary disease,  although she never smoked.  She took antibiotics and prednisone as part  of her treatment program for this illness, but says she is off of those  and now is working on rebuilding her weight and physical activity.   As part of her problem, she had a responsibility over a garden where  there was some formal presentation and she was out in fall weather  exerting herself physically and under a good deal of stress.  That  responsibility has now been passed on to somebody else.   CURRENT MEDICATIONS:  1. Actonel 150 mg  once a month.  2. Glucosamine with chondroitin.  3. A probiotic acidophilus.  4. Os-Cal with vitamin D.  5. Occasional Aleve.   INTOLERANCES/ALLERGIES:  PENICILLIN AND SULFA, said to have causes hives  and swelling.  No history of problems from latex, contrast dye or  aspirin.   REVIEW OF SYSTEMS:  No headaches.  Weight is coming back up.  Energy  level still is not back to normal.  Short of breath with activity.  A  non-productive cough currently, sore throats, ear aches, joint  stiffness.   PAST MEDICAL/SURGICAL HISTORY:  1. Allergic rhinitis.  2. Childhood pneumonias.  3. Seasonal rhinitis in the fall more than spring.  4. Hysterectomy.  5. Moh's surgery on her nose.  No exposure to tuberculosis.  6. Arthritis.  7. She took prednisone in August, with substantial bruising while she      was on it.   SOCIAL HISTORY:  Never smoked.  Occasional alcohol.  She is divorced  and  retired.  She has been a Systems analyst.   FAMILY HISTORY:  Emphysema, heart disease, arthritis and cancer.  Father  died at ae 88.  Mother living at age 56.  A sister died with cancer.  No  one is known to have allergy problems.   OBJECTIVE:  VITAL SIGNS:  Weight 121 pounds, blood pressure 112/64,  pulse 80, room air saturation 99%.  GENERAL:  Slender talkative woman, trim, in no evident distress.  SKIN:  No rash.  LYMPHADENOPATHY:  None found.  HEENT:  Pharynx is a little red.  Speech quality is normal.  There is no  stridor, no neck vein distention.  No post-nasal drip.  CHEST:  Clear with normal diaphragm expansion.  HEART:  A regular rhythm without murmur or gallop.  ABDOMEN:  No hepatosplenomegaly.  EXTREMITIES:  No clubbing, cyanosis or edema.   LABORATORY DATA:  In August of this year, a chest x-ray showed hyper-  inflation, suggesting chronic obstructive pulmonary disease.   White blood cell count 11,200 (prednisone effect?), normal eosinophil  level.   VACCINATION STATUS:  Has had a  pneumonia vaccine.  Needs a flu shot.   IMPRESSION:  1. Resolving bad bronchitis or bronchopneumonia.  2. Chest x-rays consistently suggest hyper-inflation.  We are going to      get formal pulmonary function tests to evaluate this.  It may be a      normal variant or the result of her childhood pneumonias.  3. We agreed that there was not enough problem to require a workup now      on a formal basis for the mild seasonal rhinitis.  We can come back      to that as appropriate.   PLAN:  1. Schedule PFTs.  2. Flu vaccine.  3. Return in three months, to make sure she is completely cleared, or      earlier p.r.n.   I appreciate the chance to meet her.     Clinton D. Maple Hudson, MD, Tonny Bollman, FACP  Electronically Signed    CDY/MedQ  DD: 05/10/2007  DT: 05/11/2007  Job #: [id number]   cc:   Corwin Levins, MD

## 2010-11-08 ENCOUNTER — Telehealth: Payer: Self-pay | Admitting: Gastroenterology

## 2010-11-08 NOTE — Telephone Encounter (Signed)
Pharmacy was changed in the system. Pt notified.

## 2010-11-19 ENCOUNTER — Ambulatory Visit (AMBULATORY_SURGERY_CENTER): Payer: Medicare Other | Admitting: Gastroenterology

## 2010-11-19 ENCOUNTER — Encounter: Payer: Self-pay | Admitting: Gastroenterology

## 2010-11-19 DIAGNOSIS — R197 Diarrhea, unspecified: Secondary | ICD-10-CM

## 2010-11-19 DIAGNOSIS — R634 Abnormal weight loss: Secondary | ICD-10-CM

## 2010-11-19 DIAGNOSIS — K5730 Diverticulosis of large intestine without perforation or abscess without bleeding: Secondary | ICD-10-CM

## 2010-11-19 MED ORDER — SODIUM CHLORIDE 0.9 % IV SOLN: 500.0000 mL | INTRAVENOUS | Status: DC

## 2010-11-19 NOTE — Patient Instructions (Signed)
Follow discharge instructions.  Continue your medications.  High Fiber diet  With liberal fluid intake.  Await pathology results.

## 2010-11-20 ENCOUNTER — Telehealth: Payer: Self-pay

## 2010-11-20 ENCOUNTER — Encounter: Payer: Self-pay | Admitting: Gastroenterology

## 2010-11-20 NOTE — Telephone Encounter (Signed)
Follow up Call- Patient questions:  Do you have a fever, pain , or abdominal swelling? no Pain Score  0 *  Have you tolerated food without any problems? yes  Have you been able to return to your normal activities? yes  Do you have any questions about your discharge instructions: Diet   no Medications  no Follow up visit  no  Do you have questions or concerns about your Care? no  Actions: * If pain score is 4 or above: No action needed, pain <4. Per pr "I feel really good today".  MAW

## 2010-12-04 ENCOUNTER — Encounter: Payer: Self-pay | Admitting: Gastroenterology

## 2010-12-11 ENCOUNTER — Telehealth: Payer: Self-pay | Admitting: Gastroenterology

## 2010-12-11 NOTE — Telephone Encounter (Signed)
Pt with hx of diverticulosis seen by Dr Russella Dar on 10/22/10 and scheduled for COLON on 11/19/10. Colon with normal path, pt instructed to increase fiber and fluids, use Imodium bid for diarrhea. Pt reports yesterday pm pt reports episodes of lower abdominal cramping and watery stools followed by "gel" stools that later contained BRB. The stools containing watery and or mucus occurred throughout the night and she estimates she got up about 10 times. She had more mucus with BRB after her shower and that was it. Pt stated she's eaten only toast and hot tea and stated her abdomen remains tender. Pt wanted to know if she could take ASA or tylenol tonight if cramping started again. Informed pt to take tylenol, no ASA for cramping and if diarrhea and or mucus stools occur with BRB to call us 1st thing in am. Pt stated understanding.

## 2011-04-10 NOTE — Telephone Encounter (Signed)
ERROR

## 2011-05-30 ENCOUNTER — Encounter: Payer: Self-pay | Admitting: Internal Medicine

## 2011-06-02 ENCOUNTER — Ambulatory Visit (INDEPENDENT_AMBULATORY_CARE_PROVIDER_SITE_OTHER)
Admission: RE | Admit: 2011-06-02 | Discharge: 2011-06-02 | Disposition: A | Discharge status: Home / Self Care | Payer: Medicare Other | Source: Ambulatory Visit | Attending: Internal Medicine | Admitting: Internal Medicine

## 2011-06-02 ENCOUNTER — Ambulatory Visit (INDEPENDENT_AMBULATORY_CARE_PROVIDER_SITE_OTHER): Payer: Medicare Other | Admitting: Internal Medicine

## 2011-06-02 ENCOUNTER — Encounter: Payer: Self-pay | Admitting: Internal Medicine

## 2011-06-02 VITALS — BP 138/78 | HR 94 | Ht 67.0 in | Wt 110.0 lb

## 2011-06-02 DIAGNOSIS — Z23 Encounter for immunization: Secondary | ICD-10-CM

## 2011-06-02 DIAGNOSIS — R490 Dysphonia: Secondary | ICD-10-CM

## 2011-06-02 DIAGNOSIS — J40 Bronchitis, not specified as acute or chronic: Secondary | ICD-10-CM

## 2011-06-02 NOTE — Patient Instructions (Signed)
Order- CXR  Dx bronchitis  Flu vax 

## 2011-06-02 NOTE — Progress Notes (Signed)
06/02/11- 75 yo female never smoker followed for allergic rhinitis, bronchitis LOV-07/30/2009 Never smoked but had heavy secondhand exposure as a child. Needs flu shot. Since last winter she has had persistent hoarseness and questions if this is allergy. It is not relieved by Allegra. She remains hoarse, as she was while visiting Dr. Jonny Ruiz in March. Notes trouble trying to seeing because she doesn't have her vocal range. Had lost weight with diarrhea in the summer and had colonoscopy. No established diagnosis. Taking Imodium. She has not been coughing some over the past 3 weeks, mild but noticeable. Little sputum. She denies sinus drainage, reflux symptoms or choking when she eats or drinks. Denies chest pain, palpitation, bloody or purulent sputum, night sweats or swollen glands.  ROS-see HPI Constitutional:   +  weight loss, No-night sweats, fevers, chills, fatigue, lassitude. HEENT:   No-  headaches, difficulty swallowing, tooth/dental problems, sore throat,       No-  sneezing, itching, ear ache, nasal congestion, post nasal drip, +tender over TMJ bilaterally CV:  No-   chest pain, orthopnea, PND, swelling in lower extremities, anasarca,                                  dizziness, palpitations Resp: No-   shortness of breath with exertion or at rest.              No-   productive cough,  No non-productive cough,  No- coughing up of blood.              No-   change in color of mucus.  No- wheezing.  + hoarse. Skin: No-   rash or lesions. GI:  No-   heartburn, indigestion, abdominal pain, nausea, vomiting, diarrhea,                 change in bowel habits, loss of appetite GU: No-   dysuria, change in color of urine, no urgency or frequency.  No- flank pain. MS:  No-   joint pain or swelling.  No- decreased range of motion.  No- back pain. Neuro-     nothing unusual Psych:  No- change in mood or affect. No depression or anxiety.  No memory loss.  OBJ General- Alert, Oriented,  Affect-appropriate, talkative, Distress- none acute, slender Skin- rash-none, lesions- none, excoriation- none Lymphadenopathy- none Head- atraumatic            Eyes- Gross vision intact, PERRLA, conjunctivae clear secretions            Ears- Hearing, canals-normal            Nose- Clear, no-Septal dev, mucus, polyps, erosion, perforation             Throat- Mallampati II , mucosa red , drainage- none, tonsils- atrophic Neck- flexible , trachea midline, no stridor , thyroid nl, carotid no bruit Chest - symmetrical excursion , unlabored           Heart/CV- RRR , no murmur , no gallop  , no rub, nl s1 s2                           - JVD- none , edema- none, stasis changes- none, varices- none           Lung- clear to P&A, wheeze- none, cough- none , dullness-none, rub- none  Chest wall-  Abd- tender-no, distended-no, bowel sounds-present, HSM- no Br/ Gen/ Rectal- Not done, not indicated Extrem- cyanosis- none, clubbing, none, , changes of osteoarthritis in hand joints Neuro- grossly intact to observation

## 2011-06-04 DIAGNOSIS — R490 Dysphonia: Secondary | ICD-10-CM

## 2011-06-04 HISTORY — DX: Dysphonia: R49.0

## 2011-06-04 NOTE — Assessment & Plan Note (Signed)
Variable hoarseness has lasted through the past year without specific onset and without vomiting or postnasal drainage that she will admit to now. She does not describe voice misuse. Plan chest x-ray for attention to path of recurrent laryngeal nerves. If no obvious problem, then we will refer to ENT for laryngoscopy.

## 2011-06-04 NOTE — Assessment & Plan Note (Signed)
She has not had a recent exacerbation with coughing to cause her hoarseness.

## 2011-06-05 ENCOUNTER — Telehealth: Payer: Self-pay | Admitting: *Deleted

## 2011-06-05 DIAGNOSIS — R490 Dysphonia: Secondary | ICD-10-CM

## 2011-06-05 NOTE — Progress Notes (Signed)
Quick Note:  Pt aware of results. ______ 

## 2011-06-05 NOTE — Telephone Encounter (Signed)
Per [facility] order for this as nothing was seen on her CXR-DX of hoarseness x 6 months or longer---[facility]. Pt is aware that order has been placed.

## 2011-07-18 ENCOUNTER — Other Ambulatory Visit: Payer: Self-pay | Admitting: Internal Medicine

## 2011-07-18 DIAGNOSIS — Z1231 Encounter for screening mammogram for malignant neoplasm of breast: Secondary | ICD-10-CM

## 2011-07-31 ENCOUNTER — Ambulatory Visit: Payer: Self-pay | Admitting: Internal Medicine

## 2011-08-26 ENCOUNTER — Ambulatory Visit (HOSPITAL_COMMUNITY)
Admission: RE | Admit: 2011-08-26 | Discharge: 2011-08-26 | Disposition: A | Discharge status: Home / Self Care | Payer: Medicare Other | Source: Ambulatory Visit | Attending: Internal Medicine | Admitting: Internal Medicine

## 2011-08-26 DIAGNOSIS — Z1231 Encounter for screening mammogram for malignant neoplasm of breast: Secondary | ICD-10-CM

## 2011-09-06 ENCOUNTER — Encounter: Payer: Self-pay | Admitting: Internal Medicine

## 2011-09-06 DIAGNOSIS — Z0000 Encounter for general adult medical examination without abnormal findings: Secondary | ICD-10-CM | POA: Insufficient documentation

## 2011-09-10 ENCOUNTER — Other Ambulatory Visit (INDEPENDENT_AMBULATORY_CARE_PROVIDER_SITE_OTHER): Payer: Medicare Other

## 2011-09-10 ENCOUNTER — Ambulatory Visit (INDEPENDENT_AMBULATORY_CARE_PROVIDER_SITE_OTHER): Payer: Medicare Other | Admitting: Internal Medicine

## 2011-09-10 ENCOUNTER — Other Ambulatory Visit: Payer: Self-pay | Admitting: Internal Medicine

## 2011-09-10 ENCOUNTER — Other Ambulatory Visit: Payer: Medicare Other

## 2011-09-10 ENCOUNTER — Encounter: Payer: Self-pay | Admitting: Internal Medicine

## 2011-09-10 VITALS — BP 122/82 | HR 68 | Temp 97.5°F | Ht 67.0 in | Wt 108.4 lb

## 2011-09-10 DIAGNOSIS — E785 Hyperlipidemia, unspecified: Secondary | ICD-10-CM

## 2011-09-10 DIAGNOSIS — Z0000 Encounter for general adult medical examination without abnormal findings: Secondary | ICD-10-CM

## 2011-09-10 LAB — BASIC METABOLIC PANEL
BUN: 17 mg/dL (ref 6–23)
CO2: 31 mEq/L (ref 19–32)
Glucose, Bld: 94 mg/dL (ref 70–99)
Potassium: 4.5 mEq/L (ref 3.5–5.1)
Sodium: 141 mEq/L (ref 135–145)

## 2011-09-10 LAB — URINALYSIS, ROUTINE W REFLEX MICROSCOPIC
Ketones, ur: NEGATIVE
Nitrite: NEGATIVE
Specific Gravity, Urine: 1.015 (ref 1.000–1.030)
Urobilinogen, UA: 0.2 (ref 0.0–1.0)

## 2011-09-10 LAB — CBC WITH DIFFERENTIAL/PLATELET
Basophils Absolute: 0 10*3/uL (ref 0.0–0.1)
Eosinophils Relative: 1.2 % (ref 0.0–5.0)
Lymphocytes Relative: 29.5 % (ref 12.0–46.0)
Lymphs Abs: 1.3 10*3/uL (ref 0.7–4.0)
Monocytes Relative: 10.5 % (ref 3.0–12.0)
Neutrophils Relative %: 58.2 % (ref 43.0–77.0)
Platelets: 236 10*3/uL (ref 150.0–400.0)
RDW: 14.7 % — ABNORMAL HIGH (ref 11.5–14.6)
WBC: 4.3 10*3/uL — ABNORMAL LOW (ref 4.5–10.5)

## 2011-09-10 LAB — HEPATIC FUNCTION PANEL
ALT: 20 U/L (ref 0–35)
AST: 54 U/L — ABNORMAL HIGH (ref 0–37)
Albumin: 4.2 g/dL (ref 3.5–5.2)
Alkaline Phosphatase: 74 U/L (ref 39–117)
Total Protein: 7.3 g/dL (ref 6.0–8.3)

## 2011-09-10 LAB — TSH: TSH: 1.31 u[IU]/mL (ref 0.35–5.50)

## 2011-09-10 LAB — LIPID PANEL: HDL: 75.5 mg/dL (ref >39.00)

## 2011-09-10 MED ORDER — EZETIMIBE 10 MG PO TABS: 10.0000 mg | ORAL_TABLET | Freq: Every day | ORAL | Status: DC

## 2011-09-10 NOTE — Assessment & Plan Note (Signed)

## 2011-09-10 NOTE — Patient Instructions (Signed)
Continue all other medications as before Please go to LAB in the Basement for the blood and/or urine tests to be done today You will be contacted by phone if any changes need to be made immediately.  Otherwise, you will receive a letter about your results with an explanation. You are otherwise up to date with prevention Please return in 1 year for your yearly visit, or sooner if needed, with Lab testing done 3-5 days before

## 2011-09-10 NOTE — Progress Notes (Signed)
Subjective:    Patient ID: Candace Baldwin, female    DOB: 1935/06/29, 76 y.o.   MRN: [id number]  HPI Here for wellness and f/u;  Overall doing ok;  Pt denies CP, worsening SOB, DOE, wheezing, orthopnea, PND, worsening LE edema, palpitations, dizziness or syncope.  Pt denies neurological change such as new Headache, facial or extremity weakness.  Pt denies polydipsia, polyuria, or low sugar symptoms. Pt states overall good compliance with treatment and medications, good tolerability, and trying to follow lower cholesterol diet.  Pt denies worsening depressive symptoms, suicidal ideation or panic. No fever, wt loss, night sweats, loss of appetite, or other constitutional symptoms.  Pt states good ability with ADL's, low fall risk, home safety reviewed and adequate, no significant changes in hearing or vision, and occasionally active with exercise with going to the gym 3 times per wk.  Lost quite a large amount of money in the stock market with marked increase in stress, and a home that needs mult repairs. Has chronic hoarseness, has seen ENT. Has what sounds like nasopharyngoscopy - neg exam, though had "age related" hoarseness.  Has gained 5 lbs in the past yr, still remains thin appearing, getting used to the stress she says Past Medical History  Diagnosis Date  . Skin cancer   . DJD (degenerative joint disease)   . Diverticulosis   . Heart murmur   . Hyperlipidemia   . Osteoporosis   . Allergic rhinitis   . Shingles   . HYPERLIPIDEMIA 01/23/2007    Qualifier: Diagnosis of  By: Jonny Ruiz MD, Len Blalock   . ALLERGIC RHINITIS 10/29/2007    Qualifier: Diagnosis of  By: Jonny Ruiz MD, Len Blalock   . SKIN CANCER, HX OF 01/23/2007    Qualifier: Diagnosis of  By: Jonny Ruiz MD, Len Blalock   . OSTEOPOROSIS 10/29/2007    Qualifier: Diagnosis of  By: Jonny Ruiz MD, Len Blalock   . DIVERTICULOSIS, COLON 01/23/2007    Qualifier: Diagnosis of  By: Jonny Ruiz MD, Len Blalock   . DEGENERATIVE JOINT DISEASE, HANDS 01/23/2007    Qualifier: Diagnosis of  By: Jonny Ruiz  MD, Len Blalock   . Chronic hoarseness 06/04/2011    Onset winter of 2011-2012    Past Surgical History  Procedure Date  . Vaginal hysterectomy 1990  . Mohs surgery     Nose  . Cataract extraction, bilateral   . Foot surgery 2011    Bunion with prolonged recovery    reports that she has never smoked. She has never used smokeless tobacco. She reports that she drinks alcohol. She reports that she does not use illicit drugs. family history includes Heart failure in her father and Rheum arthritis in her paternal grandmother. Allergies  Allergen Reactions  . Atorvastatin     REACTION: gait problem  . Montelukast Sodium   . Penicillins   . Simvastatin   . Sulfonamide Derivatives    Current Outpatient Prescriptions on File Prior to Visit  Medication Sig Dispense Refill  . Certolizumab Pegol (CIMZIA) 2 X 200 MG KIT Mix with grape juice daily       . Omega-3 Fatty Acids (FISH OIL) 1000 MG CAPS Take 1 capsule by mouth daily.        . Probiotic Product (PROBIOTIC & ACIDOPHILUS EX ST PO) Take 1 tablet by mouth daily.        . Calcium Carb-Vit D-C-E-Mineral (OS-CAL ULTRA) 600 MG TABS Take 1 tablet by mouth daily.        . fexofenadine (  ALLEGRA) 180 MG tablet Take 180 mg by mouth daily as needed.       . Multiple Vitamin (MULTIVITAMIN) tablet Take 1 tablet by mouth daily.         Current Facility-Administered Medications on File Prior to Visit  Medication Dose Route Frequency Provider Last Rate Last Dose  . 0.9 %  sodium chloride infusion  500 mL Intravenous Continuous Meryl Dare, MD,FACG       Review of Systems Review of Systems  Constitutional: Negative for diaphoresis, activity change, appetite change and unexpected weight change.  HENT: Negative for hearing loss, ear pain, facial swelling, mouth sores and neck stiffness.   Eyes: Negative for pain, redness and visual disturbance.  Respiratory: Negative for shortness of breath and wheezing.   Cardiovascular: Negative for chest pain  and palpitations.  Gastrointestinal: Negative for diarrhea, blood in stool, abdominal distention and rectal pain.  Genitourinary: Negative for hematuria, flank pain and decreased urine volume.  Musculoskeletal: Negative for myalgias and joint swelling.  Skin: Negative for color change and wound.  Neurological: Negative for syncope and numbness.  Hematological: Negative for adenopathy.  Psychiatric/Behavioral: Negative for hallucinations, self-injury, decreased concentration and agitation.      Objective:   Physical Exam BP 122/82  Pulse 68  Temp(Src) 97.5 F (36.4 C) (Oral)  Ht 5\' 7"  (1.702 m)  Wt 108 lb 6 oz (49.159 kg)  BMI 16.97 kg/m2  SpO2 97% Physical Exam  VS noted Constitutional: Pt is oriented to person, place, and time. Appears well-developed and well-nourished. Candace Baldwin side HENT:  Head: Normocephalic and atraumatic.  Right Ear: External ear normal.  Left Ear: External ear normal.  Nose: Nose normal.  Mouth/Throat: Oropharynx is clear and moist.  Eyes: Conjunctivae and EOM are normal. Pupils are equal, round, and reactive to light.  Neck: Normal range of motion. Neck supple. No JVD present. No tracheal deviation present.  Cardiovascular: Normal rate, regular rhythm, normal heart sounds and intact distal pulses.   Pulmonary/Chest: Effort normal and breath sounds normal.  Abdominal: Soft. Bowel sounds are normal. There is no tenderness.  Musculoskeletal: Normal range of motion. Exhibits no edema.  Lymphadenopathy:  Has no cervical adenopathy.  Neurological: Pt is alert and oriented to person, place, and time. Pt has normal reflexes. No cranial nerve deficit.  Skin: Skin is warm and dry. No rash noted.  Psychiatric:  Has  normal mood and affect. Behavior is normal. 1-2+ nervous    Assessment & Plan:

## 2011-09-10 NOTE — Assessment & Plan Note (Signed)
Has been statin intolerant, to consider zetia, goal ldl < 100

## 2011-09-14 ENCOUNTER — Encounter: Payer: Self-pay | Admitting: Internal Medicine

## 2011-11-19 ENCOUNTER — Telehealth: Payer: Self-pay | Admitting: Internal Medicine

## 2011-11-19 MED ORDER — PROMETHAZINE-CODEINE 6.25-10 MG/5ML PO SYRP: 5.0000 mL | ORAL_SOLUTION | Freq: Four times a day (QID) | ORAL | Status: AC | PRN

## 2011-11-19 NOTE — Telephone Encounter (Signed)
Per [facility] okay to refill #200 ml 1 tsp q6hrs prn cough 0 refills  Pt aware rx has been called in and nothing further was needed

## 2011-11-19 NOTE — Telephone Encounter (Signed)
I spoke with pt and she c/o deep cough w/ pales yellow phlem, raspy voice x yesterday. Denies any f/c/s/n/v. Pt is requesting to have promethazine w/ codeine cough syrup called in for her. Please advise Dr. Maple Hudson thanks  Allergies  Allergen Reactions  . Atorvastatin     REACTION: gait problem  . Montelukast Sodium   . Penicillins   . Simvastatin   . Sulfonamide Derivatives      [facility] [place]

## 2012-07-29 ENCOUNTER — Encounter: Payer: Self-pay | Admitting: Internal Medicine

## 2012-07-29 ENCOUNTER — Ambulatory Visit (INDEPENDENT_AMBULATORY_CARE_PROVIDER_SITE_OTHER): Payer: Medicare Other | Admitting: Internal Medicine

## 2012-07-29 ENCOUNTER — Ambulatory Visit (INDEPENDENT_AMBULATORY_CARE_PROVIDER_SITE_OTHER)
Admission: RE | Admit: 2012-07-29 | Discharge: 2012-07-29 | Disposition: A | Discharge status: Home / Self Care | Payer: Medicare Other | Source: Ambulatory Visit | Attending: Internal Medicine | Admitting: Internal Medicine

## 2012-07-29 VITALS — BP 118/64 | HR 99 | Ht 67.0 in | Wt 110.4 lb

## 2012-07-29 DIAGNOSIS — R05 Cough: Secondary | ICD-10-CM

## 2012-07-29 DIAGNOSIS — R059 Cough, unspecified: Secondary | ICD-10-CM

## 2012-07-29 DIAGNOSIS — R0989 Other specified symptoms and signs involving the circulatory and respiratory systems: Secondary | ICD-10-CM

## 2012-07-29 DIAGNOSIS — J441 Chronic obstructive pulmonary disease with (acute) exacerbation: Secondary | ICD-10-CM

## 2012-07-29 DIAGNOSIS — R0600 Dyspnea, unspecified: Secondary | ICD-10-CM

## 2012-07-29 DIAGNOSIS — R0609 Other forms of dyspnea: Secondary | ICD-10-CM

## 2012-07-29 MED ORDER — ACLIDINIUM BROMIDE 400 MCG/ACT IN AEPB: 1.0000 | INHALATION_SPRAY | Freq: Two times a day (BID) | RESPIRATORY_TRACT | Status: DC

## 2012-07-29 NOTE — Progress Notes (Signed)
06/02/11- 77 yo female never smoker followed for allergic rhinitis, bronchitis LOV-07/30/2009 Never smoked but had heavy secondhand exposure as a child. Needs flu shot. Since last winter she has had persistent hoarseness and questions if this is allergy. It is not relieved by Allegra. She remains hoarse, as she was while visiting Dr. Jonny Ruiz in March. Notes trouble trying to sing because she doesn't have her vocal range. Had lost weight with diarrhea in the summer and had colonoscopy. No established diagnosis. Taking Imodium. She has  been coughing some over the past 3 weeks, mild but noticeable. Little sputum. She denies sinus drainage, reflux symptoms or choking when she eats or drinks. Denies chest pain, palpitation, bloody or purulent sputum, night sweats or swollen glands.  07/29/12- 77 yo female never smoker(+ 2nd hand), followed for allergic rhinitis, bronchitis FOLLOWS FOR: states breathing is doing much better since last visit; has noticed increased cough-to the point hard to catch breath. Had productive cough for 2 weeks late December early January. Denies fever or blood. Getting better now. Chest still feels heavy and she may be a little more short of breath with exertion.she never used her pro-air rescue inhaler and is unsure about technique. Taking a homeopathic product Certo, containing pectin, with great juice, for her joints. CXR 06/05/11 IMPRESSION:  Stable probable mild chronic emphysematous changes. No acute  cardiopulmonary process.  Original Report Authenticated By: Gerrianne Scale, M.D.  //need PFT, a1AT//  ROS-see HPI Constitutional:   +  weight loss, No-night sweats, fevers, chills, fatigue, lassitude. HEENT:   No-  headaches, difficulty swallowing, tooth/dental problems, sore throat,       No-  sneezing, itching, ear ache, nasal congestion, post nasal drip,   CV:  No-   chest pain, orthopnea, PND, swelling in lower extremities, anasarca,                                   dizziness, palpitations Resp: +   shortness of breath with exertion or at rest.              + productive cough,  No non-productive cough,  No- coughing up of blood.              No-   change in color of mucus.  No- wheezing.  + hoarse. Skin: No-   rash or lesions. GI:  No-   heartburn, indigestion, abdominal pain, nausea, vomiting,  GU: . MS:  No-   joint pain or swelling.   Neuro-     nothing unusual Psych:  No- change in mood or affect. No depression or anxiety.  No memory loss.  OBJ General- Alert, Oriented, Affect-appropriate, talkative, Distress- none acute, slender Skin- rash-none, lesions- none, excoriation- none Lymphadenopathy- none Head- atraumatic            Eyes- Gross vision intact, PERRLA, conjunctivae clear secretions            Ears- Hearing, canals-normal            Nose- Clear, no-Septal dev, mucus, polyps, erosion, perforation             Throat- Mallampati II , mucosa red , drainage- none, tonsils- atrophic Neck- flexible , trachea midline, no stridor , thyroid nl, carotid no bruit Chest - symmetrical excursion , unlabored           Heart/CV- RRR , no murmur , no gallop  , no  rub, nl s1 s2                           - JVD- none , edema- none, stasis changes- none, varices- none           Lung- clear to P&A, wheeze- none, cough- none , dullness-none, rub- none           Chest wall-  Abd-  Br/ Gen/ Rectal- Not done, not indicated Extrem- cyanosis- none, clubbing, none, , changes of osteoarthritis in hand joints Neuro- grossly intact to observation

## 2012-07-29 NOTE — Patient Instructions (Addendum)
Sample Tudorza inhaler     1 puff, twice daily for regular use for maintenance  Ok to use the [employer] rescue albuterol HFA inhaler if needed for shortness of breath, tightness or wheeze    2 puffs, up to 4 times daily if needed  Order- CXR   Dx  Cough, dyspnea  Fine to start back at the "Y"  Please call as needed

## 2012-07-30 MED ORDER — ALBUTEROL SULFATE HFA 108 (90 BASE) MCG/ACT IN AERS: 2.0000 | INHALATION_SPRAY | Freq: Four times a day (QID) | RESPIRATORY_TRACT | Status: DC | PRN

## 2012-07-30 NOTE — Progress Notes (Signed)
Quick Note:  Pt aware of results. ______ 

## 2012-08-06 DIAGNOSIS — J441 Chronic obstructive pulmonary disease with (acute) exacerbation: Secondary | ICD-10-CM | POA: Insufficient documentation

## 2012-08-06 NOTE — Assessment & Plan Note (Addendum)
Recent acute bronchitis gradually resolving. She did have flu vaccine. Plan-chest x-ray, sample Tudorza inhaler with instruction and discussion.

## 2012-09-02 ENCOUNTER — Encounter: Payer: Self-pay | Admitting: Internal Medicine

## 2012-09-02 ENCOUNTER — Other Ambulatory Visit: Payer: Medicare Other

## 2012-09-02 ENCOUNTER — Ambulatory Visit (INDEPENDENT_AMBULATORY_CARE_PROVIDER_SITE_OTHER): Payer: Medicare Other | Admitting: Internal Medicine

## 2012-09-02 VITALS — BP 112/72 | HR 74 | Ht 67.0 in | Wt 108.4 lb

## 2012-09-02 DIAGNOSIS — J209 Acute bronchitis, unspecified: Secondary | ICD-10-CM

## 2012-09-02 DIAGNOSIS — J441 Chronic obstructive pulmonary disease with (acute) exacerbation: Secondary | ICD-10-CM

## 2012-09-02 DIAGNOSIS — J45901 Unspecified asthma with (acute) exacerbation: Secondary | ICD-10-CM

## 2012-09-02 NOTE — Patient Instructions (Addendum)
Order- lab- a1AT assay   Dx  Acute bronchitis  Ok to use the red albuterol HFA Proair rescue inhaler  2 puffs, up to 4 times daily, as needed for shortness of breath, chest tightness, wheeze  We don't need to give prescription now for [place]

## 2012-09-02 NOTE — Progress Notes (Signed)
06/02/11- 77 yo female never smoker followed for allergic rhinitis, bronchitis LOV-07/30/2009 Never smoked but had heavy secondhand exposure as a child. Needs flu shot. Since last winter she has had persistent hoarseness and questions if this is allergy. It is not relieved by Allegra. She remains hoarse, as she was while visiting Dr. Jonny Ruiz in March. Notes trouble trying to sing because she doesn't have her vocal range. Had lost weight with diarrhea in the summer and had colonoscopy. No established diagnosis. Taking Imodium. She has  been coughing some over the past 3 weeks, mild but noticeable. Little sputum. She denies sinus drainage, reflux symptoms or choking when she eats or drinks. Denies chest pain, palpitation, bloody or purulent sputum, night sweats or swollen glands.  07/29/12- 77 yo female never smoker(+ 2nd hand), followed for allergic rhinitis, bronchitis FOLLOWS FOR: states breathing is doing much better since last visit; has noticed increased cough-to the point hard to catch breath. Had productive cough for 2 weeks late December early January. Denies fever or blood. Getting better now. Chest still feels heavy and she may be a little more short of breath with exertion.she never used her pro-air rescue inhaler and is unsure about technique. Taking a homeopathic product Certo, containing pectin, with great juice, for her joints. CXR 06/05/11 IMPRESSION:  Stable probable mild chronic emphysematous changes. No acute  cardiopulmonary process.  Original Report Authenticated By: Gerrianne Scale, M.D.  09/02/12- 77 yo female never smoker(+ 2nd hand), followed for allergic rhinitis, bronchitis Feeling very well. We discussed her secondhand cigarette smoke exposure to her father. He was a 3 pack per day smoker, dying of emphysema at age 68. She asks about thyroid function testing because of thinning hair and I asked her to talk with her primary physician. CXR 07/30/12- images reviewed with her. Some  old scarring, but not emphysema. IMPRESSION:  No acute disease.  Original Report Authenticated By: Francene Boyers, M.D.  ROS-see HPI Constitutional:   +  weight loss, No-night sweats, fevers, chills, fatigue, lassitude. HEENT:   No-  headaches, difficulty swallowing, tooth/dental problems, sore throat,       No-  sneezing, itching, ear ache, nasal congestion, post nasal drip,   CV:  No-   chest pain, orthopnea, PND, swelling in lower extremities, anasarca,                                  dizziness, palpitations Resp: +  less shortness of breath with exertion or at rest.              No- productive cough,  No non-productive cough,  No- coughing up of blood.              No-   change in color of mucus.  No- wheezing.   Skin: No-   rash or lesions. GI:  No-   heartburn, indigestion, abdominal pain, nausea, vomiting,  GU: . MS:  No-   joint pain or swelling.   Neuro-     nothing unusual Psych:  No- change in mood or affect. No depression or anxiety.  No memory loss.  OBJ General- Alert, Oriented, Affect-appropriate, talkative, Distress- none acute, slender Skin- rash-none, lesions- none, excoriation- none Lymphadenopathy- none Head- atraumatic            Eyes- Gross vision intact, PERRLA, conjunctivae clear secretions            Ears- Hearing, canals-normal  Nose- Clear, no-Septal dev, mucus, polyps, erosion, perforation             Throat- Mallampati II , mucosa red , drainage- none, tonsils- atrophic Neck- flexible , trachea midline, no stridor , thyroid nl, carotid no bruit Chest - symmetrical excursion , unlabored           Heart/CV- RRR , no murmur , no gallop  , no rub, nl s1 s2                           - JVD- none , edema- none, stasis changes- none, varices- none           Lung- clear to P&A, wheeze- none, cough- none , dullness-none, rub- none           Chest wall-  Abd-  Br/ Gen/ Rectal- Not done, not indicated Extrem- cyanosis- none, clubbing, none, , changes of  osteoarthritis in hand joints Neuro- grossly intact to observation

## 2012-09-04 NOTE — Assessment & Plan Note (Signed)
Not at all clear that she has COPD. There might be a mild reversible asthma. She would like one year followup. Plan-full old PFT from 3 years ago. a1AT assay, rescue HFA inhaler to keep available.

## 2012-09-10 ENCOUNTER — Ambulatory Visit (INDEPENDENT_AMBULATORY_CARE_PROVIDER_SITE_OTHER): Payer: Medicare Other | Admitting: Internal Medicine

## 2012-09-10 ENCOUNTER — Other Ambulatory Visit (INDEPENDENT_AMBULATORY_CARE_PROVIDER_SITE_OTHER): Payer: Medicare Other

## 2012-09-10 ENCOUNTER — Telehealth: Payer: Self-pay | Admitting: Internal Medicine

## 2012-09-10 ENCOUNTER — Encounter: Payer: Self-pay | Admitting: Internal Medicine

## 2012-09-10 VITALS — BP 108/72 | HR 86 | Temp 97.4°F | Ht 67.0 in | Wt 107.1 lb

## 2012-09-10 DIAGNOSIS — Z136 Encounter for screening for cardiovascular disorders: Secondary | ICD-10-CM

## 2012-09-10 DIAGNOSIS — Z0000 Encounter for general adult medical examination without abnormal findings: Secondary | ICD-10-CM

## 2012-09-10 DIAGNOSIS — E785 Hyperlipidemia, unspecified: Secondary | ICD-10-CM

## 2012-09-10 LAB — URINALYSIS, ROUTINE W REFLEX MICROSCOPIC
Bilirubin Urine: NEGATIVE
Hgb urine dipstick: NEGATIVE
Ketones, ur: NEGATIVE
Leukocytes, UA: NEGATIVE
Specific Gravity, Urine: 1.015 (ref 1.000–1.030)
Urobilinogen, UA: 0.2 (ref 0.0–1.0)

## 2012-09-10 LAB — HEPATIC FUNCTION PANEL
AST: 48 U/L — ABNORMAL HIGH (ref 0–37)
Albumin: 4.3 g/dL (ref 3.5–5.2)
Alkaline Phosphatase: 69 U/L (ref 39–117)
Total Protein: 7.2 g/dL (ref 6.0–8.3)

## 2012-09-10 LAB — CBC WITH DIFFERENTIAL/PLATELET
Basophils Absolute: 0 10*3/uL (ref 0.0–0.1)
Eosinophils Absolute: 0.1 10*3/uL (ref 0.0–0.7)
HCT: 42.6 % (ref 36.0–46.0)
Lymphs Abs: 1.2 10*3/uL (ref 0.7–4.0)
Monocytes Absolute: 0.6 10*3/uL (ref 0.1–1.0)
Monocytes Relative: 14.3 % — ABNORMAL HIGH (ref 3.0–12.0)
Platelets: 244 10*3/uL (ref 150.0–400.0)
RDW: 15.4 % — ABNORMAL HIGH (ref 11.5–14.6)

## 2012-09-10 LAB — BASIC METABOLIC PANEL
CO2: 29 mEq/L (ref 19–32)
GFR: 67.46 mL/min (ref >60.00)
Glucose, Bld: 99 mg/dL (ref 70–99)
Potassium: 4.1 mEq/L (ref 3.5–5.1)
Sodium: 139 mEq/L (ref 135–145)

## 2012-09-10 LAB — TSH: TSH: 1.18 u[IU]/mL (ref 0.35–5.50)

## 2012-09-10 NOTE — Assessment & Plan Note (Addendum)

## 2012-09-10 NOTE — Telephone Encounter (Signed)
Result Note    Normal alpha 1 antitrypsin gene- not at unusual risk for emphysema   I spoke with patient about results and she verbalized understanding and had no questions

## 2012-09-10 NOTE — Patient Instructions (Addendum)
Please continue all other medications as before, and refills have been done if requested. Please continue your efforts at being more active, low cholesterol diet, and weight control. You are otherwise up to date with prevention measures today. Please go to the LAB in the Basement (turn left off the elevator) for the tests to be done today You will be contacted by phone if any changes need to be made immediately.  Otherwise, you will receive a letter about your results with an explanation, but please check with MyChart first. Please remember to followup with your GYN for the yearly pap smear and/or mammogram Thank you for enrolling in MyChart. Please follow the instructions below to securely access your online medical record. MyChart allows you to send messages to your doctor, view your test results, renew your prescriptions, schedule appointments, and more. To Log into My Chart online, please go by [employer] or [employer] to [email or web address], or download the MyChart App from the [employer] .  Your Username is: [email or web address] (pass nicholas) Please send a practice Message on Mychart later today. Please return in 1 year for your yearly visit, or sooner if needed, with Lab testing done 3-5 days before

## 2012-09-10 NOTE — Progress Notes (Signed)
Subjective:    Patient ID: Candace Baldwin, female    DOB: 1935-12-04, 77 y.o.   MRN: [id number]  HPI Here for wellness and f/u;  Overall doing ok;  Pt denies CP, worsening SOB, DOE, wheezing, orthopnea, PND, worsening LE edema, palpitations, dizziness or syncope.  Pt denies neurological change such as new headache, facial or extremity weakness.  Pt denies polydipsia, polyuria, or low sugar symptoms. Pt states overall good compliance with treatment and medications, good tolerability, and has been trying to follow lower cholesterol diet.  Pt denies worsening depressive symptoms, suicidal ideation or panic. No fever, night sweats, wt loss, loss of appetite, or other constitutional symptoms.  Pt states good ability with ADL's, has low fall risk, home safety reviewed and adequate, no other significant changes in hearing or vision, and only occasionally active with exercise.  No acute complaints Past Medical History  Diagnosis Date  . Skin cancer   . DJD (degenerative joint disease)   . Diverticulosis   . Heart murmur   . Hyperlipidemia   . Osteoporosis   . Allergic rhinitis   . Shingles   . HYPERLIPIDEMIA 01/23/2007    Qualifier: Diagnosis of  By: Jonny Ruiz MD, Len Blalock   . ALLERGIC RHINITIS 10/29/2007    Qualifier: Diagnosis of  By: Jonny Ruiz MD, Len Blalock   . SKIN CANCER, HX OF 01/23/2007    Qualifier: Diagnosis of  By: Jonny Ruiz MD, Len Blalock   . OSTEOPOROSIS 10/29/2007    Qualifier: Diagnosis of  By: Jonny Ruiz MD, Len Blalock   . DIVERTICULOSIS, COLON 01/23/2007    Qualifier: Diagnosis of  By: Jonny Ruiz MD, Len Blalock   . DEGENERATIVE JOINT DISEASE, HANDS 01/23/2007    Qualifier: Diagnosis of  By: Jonny Ruiz MD, Len Blalock   . Chronic hoarseness 06/04/2011    Onset winter of 2011-2012    Past Surgical History  Procedure Laterality Date  . Vaginal hysterectomy  1990  . Mohs surgery      Nose  . Cataract extraction, bilateral    . Foot surgery  2011    Bunion with prolonged recovery    reports that she has never smoked. She has never used  smokeless tobacco. She reports that  drinks alcohol. She reports that she does not use illicit drugs. family history includes Heart failure in her father and Rheum arthritis in her paternal grandmother. Allergies  Allergen Reactions  . Atorvastatin     REACTION: gait problem/started falling while on medication---pt took herself off medication  . Montelukast Sodium   . Penicillins   . Simvastatin   . Sulfonamide Derivatives    Current Outpatient Prescriptions on File Prior to Visit  Medication Sig Dispense Refill  . Aclidinium Bromide (TUDORZA PRESSAIR) 400 MCG/ACT AEPB Inhale 1 puff into the lungs 2 (two) times daily.  1 each  0  . albuterol (PROAIR HFA) 108 (90 BASE) MCG/ACT inhaler Inhale 2 puffs into the lungs 4 (four) times daily as needed.  1 Inhaler  11  . cholecalciferol (VITAMIN D) 1000 UNITS tablet Take 1,000 Units by mouth daily.      . Multiple Vitamins-Minerals (CENTRUM SILVER PO) Take 1 capsule by mouth daily.      . Omega-3 Fatty Acids (FISH OIL) 1000 MG CAPS Take 1 capsule by mouth daily.        . Probiotic Product (PROBIOTIC & ACIDOPHILUS EX ST PO) Take 1 tablet by mouth daily.        . [DISCONTINUED] Calcium Carb-Vit D-C-E-Mineral (OS-CAL ULTRA) 600  MG TABS Take 1 tablet by mouth daily.        . [DISCONTINUED] fexofenadine (ALLEGRA) 180 MG tablet Take 180 mg by mouth daily as needed.        No current facility-administered medications on file prior to visit.   Review of Systems Constitutional: Negative for diaphoresis, activity change, appetite change or unexpected weight change.  HENT: Negative for hearing loss, ear pain, facial swelling, mouth sores and neck stiffness.   Eyes: Negative for pain, redness and visual disturbance.  Respiratory: Negative for shortness of breath and wheezing.   Cardiovascular: Negative for chest pain and palpitations.  Gastrointestinal: Negative for diarrhea, blood in stool, abdominal distention or other pain Genitourinary: Negative for  hematuria, flank pain or change in urine volume.  Musculoskeletal: Negative for myalgias and joint swelling.  Skin: Negative for color change and wound.  Neurological: Negative for syncope and numbness. other than noted Hematological: Negative for adenopathy.  Psychiatric/Behavioral: Negative for hallucinations, self-injury, decreased concentration and agitation.      Objective:   Physical Exam BP 108/72  Pulse 86  Temp(Src) 97.4 F (36.3 C) (Oral)  Ht 5\' 7"  (1.702 m)  Wt 107 lb 2 oz (48.592 kg)  BMI 16.77 kg/m2  SpO2 98% VS noted,  Constitutional: Pt is oriented to person, place, and time. Appears well-developed and well-nourished.  Head: Normocephalic and atraumatic.  Right Ear: External ear normal.  Left Ear: External ear normal.  Nose: Nose normal.  Mouth/Throat: Oropharynx is clear and moist.  Eyes: Conjunctivae and EOM are normal. Pupils are equal, round, and reactive to light.  Neck: Normal range of motion. Neck supple. No JVD present. No tracheal deviation present.  Cardiovascular: Normal rate, regular rhythm, normal heart sounds and intact distal pulses.   Pulmonary/Chest: Effort normal and breath sounds normal.  Abdominal: Soft. Bowel sounds are normal. There is no tenderness. No HSM  Musculoskeletal: Normal range of motion. Exhibits no edema.  Lymphadenopathy:  Has no cervical adenopathy.  Neurological: Pt is alert and oriented to person, place, and time. Pt has normal reflexes. No cranial nerve deficit.  Skin: Skin is warm and dry. No rash noted.  Psychiatric:  Has  normal mood and affect. Behavior is normal.     Assessment & Plan:

## 2012-10-04 ENCOUNTER — Encounter: Payer: Self-pay | Admitting: Internal Medicine

## 2012-12-29 ENCOUNTER — Other Ambulatory Visit: Payer: Self-pay | Admitting: Dermatology

## 2013-01-10 ENCOUNTER — Other Ambulatory Visit: Payer: Self-pay | Admitting: Internal Medicine

## 2013-01-10 DIAGNOSIS — Z1231 Encounter for screening mammogram for malignant neoplasm of breast: Secondary | ICD-10-CM

## 2013-01-12 ENCOUNTER — Ambulatory Visit (HOSPITAL_COMMUNITY): Payer: Medicare Other

## 2013-04-28 ENCOUNTER — Other Ambulatory Visit: Payer: Self-pay

## 2013-07-12 ENCOUNTER — Ambulatory Visit (INDEPENDENT_AMBULATORY_CARE_PROVIDER_SITE_OTHER): Payer: Medicare Other | Admitting: Physician Assistant

## 2013-07-12 ENCOUNTER — Encounter: Payer: Self-pay | Admitting: Physician Assistant

## 2013-07-12 ENCOUNTER — Telehealth: Payer: Self-pay | Admitting: Gastroenterology

## 2013-07-12 ENCOUNTER — Other Ambulatory Visit (INDEPENDENT_AMBULATORY_CARE_PROVIDER_SITE_OTHER): Payer: Medicare Other

## 2013-07-12 VITALS — BP 120/66 | HR 68 | Temp 98.4°F | Ht 65.5 in | Wt 109.4 lb

## 2013-07-12 DIAGNOSIS — Z0000 Encounter for general adult medical examination without abnormal findings: Secondary | ICD-10-CM

## 2013-07-12 DIAGNOSIS — R1032 Left lower quadrant pain: Secondary | ICD-10-CM

## 2013-07-12 DIAGNOSIS — E785 Hyperlipidemia, unspecified: Secondary | ICD-10-CM

## 2013-07-12 DIAGNOSIS — K5732 Diverticulitis of large intestine without perforation or abscess without bleeding: Secondary | ICD-10-CM

## 2013-07-12 DIAGNOSIS — K5792 Diverticulitis of intestine, part unspecified, without perforation or abscess without bleeding: Secondary | ICD-10-CM

## 2013-07-12 LAB — CBC WITH DIFFERENTIAL/PLATELET
BASOS ABS: 0 10*3/uL (ref 0.0–0.1)
Basophils Absolute: 0 10*3/uL (ref 0.0–0.1)
Basophils Relative: 0.1 % (ref 0.0–3.0)
Basophils Relative: 0.3 % (ref 0.0–3.0)
EOS ABS: 0 10*3/uL (ref 0.0–0.7)
EOS PCT: 0 % (ref 0.0–5.0)
Eosinophils Absolute: 0 10*3/uL (ref 0.0–0.7)
Eosinophils Relative: 0 % (ref 0.0–5.0)
HCT: 38.8 % (ref 36.0–46.0)
HEMATOCRIT: 38.3 % (ref 36.0–46.0)
HEMOGLOBIN: 12.8 g/dL (ref 12.0–15.0)
Hemoglobin: 12.8 g/dL (ref 12.0–15.0)
Lymphocytes Relative: 7 % — ABNORMAL LOW (ref 12.0–46.0)
Lymphs Abs: 0.9 10*3/uL (ref 0.7–4.0)
Lymphs Abs: 1 10*3/uL (ref 0.7–4.0)
MCHC: 32.9 g/dL (ref 30.0–36.0)
MCHC: 33.4 g/dL (ref 30.0–36.0)
MCV: 92.4 fl (ref 78.0–100.0)
MCV: 93.5 fl (ref 78.0–100.0)
Monocytes Absolute: 0.6 10*3/uL (ref 0.1–1.0)
Monocytes Absolute: 0.6 10*3/uL (ref 0.1–1.0)
Monocytes Relative: 3.9 % (ref 3.0–12.0)
Monocytes Relative: 4 % (ref 3.0–12.0)
Neutro Abs: 12.7 10*3/uL — ABNORMAL HIGH (ref 1.4–7.7)
Neutro Abs: 13.2 10*3/uL — ABNORMAL HIGH (ref 1.4–7.7)
Neutrophils Relative %: 89.7 % — ABNORMAL HIGH (ref 43.0–77.0)
Platelets: 210 10*3/uL (ref 150.0–400.0)
Platelets: 216 10*3/uL (ref 150.0–400.0)
RBC: 4.15 Mil/uL (ref 3.87–5.11)
RBC: 4.15 Mil/uL (ref 3.87–5.11)
RDW: 15.1 % — AB (ref 11.5–14.6)
RDW: 15.1 % — AB (ref 11.5–14.6)
WBC: 14.4 10*3/uL — ABNORMAL HIGH (ref 4.5–10.5)
WBC: 14.8 10*3/uL — ABNORMAL HIGH (ref 4.5–10.5)

## 2013-07-12 LAB — URINALYSIS, ROUTINE W REFLEX MICROSCOPIC
BILIRUBIN URINE: NEGATIVE
HGB URINE DIPSTICK: NEGATIVE
Ketones, ur: NEGATIVE
LEUKOCYTES UA: NEGATIVE
Nitrite: NEGATIVE
PH: 6 (ref 5.0–8.0)
Specific Gravity, Urine: 1.01 (ref 1.000–1.030)
Total Protein, Urine: NEGATIVE
URINE GLUCOSE: NEGATIVE
UROBILINOGEN UA: 0.2 (ref 0.0–1.0)

## 2013-07-12 LAB — BASIC METABOLIC PANEL
BUN: 21 mg/dL (ref 6–23)
CO2: 28 mEq/L (ref 19–32)
CREATININE: 0.8 mg/dL (ref 0.4–1.2)
Calcium: 9.4 mg/dL (ref 8.4–10.5)
Chloride: 102 mEq/L (ref 96–112)
GFR: 74.15 mL/min (ref >60.00)
Glucose, Bld: 105 mg/dL — ABNORMAL HIGH (ref 70–99)
POTASSIUM: 3.9 meq/L (ref 3.5–5.1)
SODIUM: 137 meq/L (ref 135–145)

## 2013-07-12 LAB — LIPID PANEL
CHOLESTEROL: 183 mg/dL (ref 0–200)
HDL: 86.3 mg/dL (ref >39.00)
LDL Cholesterol: 90 mg/dL (ref 0–99)
TRIGLYCERIDES: 33 mg/dL (ref 0.0–149.0)
Total CHOL/HDL Ratio: 2
VLDL: 6.6 mg/dL (ref 0.0–40.0)

## 2013-07-12 LAB — HEPATIC FUNCTION PANEL
ALBUMIN: 4 g/dL (ref 3.5–5.2)
ALT: 20 U/L (ref 0–35)
AST: 50 U/L — AB (ref 0–37)
Alkaline Phosphatase: 67 U/L (ref 39–117)
Bilirubin, Direct: 0.1 mg/dL (ref 0.0–0.3)
Total Bilirubin: 0.7 mg/dL (ref 0.3–1.2)
Total Protein: 7.4 g/dL (ref 6.0–8.3)

## 2013-07-12 MED ORDER — METRONIDAZOLE 500 MG PO TABS: 500.0000 mg | ORAL_TABLET | Freq: Two times a day (BID) | ORAL | Status: DC

## 2013-07-12 MED ORDER — CIPROFLOXACIN HCL 500 MG PO TABS: 500.0000 mg | ORAL_TABLET | Freq: Two times a day (BID) | ORAL | Status: DC

## 2013-07-12 NOTE — Progress Notes (Signed)
Reviewed and agree with management plan.  Malcolm T. Stark, MD FACG 

## 2013-07-12 NOTE — Progress Notes (Signed)
Subjective:    Patient ID: Candace Baldwin, female    DOB: [birth year], 78 y.o.   MRN: [id number]  HPI  D.. is a very nice 78 year old white female known to Dr. Fuller Plan. She had colonoscopy in May of 2012 with finding of moderate diffuse diverticulosis and severe diverticulosis in the sigmoid colon. Random biopsies were done and were benign. Other medical problems include asthma and COPD, hyperlipidemia and osteoporosis. She has not been in the office since 2012 and comes in today as an acute abdomen with complaints of onset of lower abdominal pain last evening. She said the pain started in her mid abdomen and then radiated bilaterally to her lower quadrants, no back pain. No nausea or vomiting but has been afraid to eat.. Last evening she was awakened with shaking chills. She has not had any diaphoresis or documented fever. She has had some mild chills today but again no fever that she's been aware of. She has no dysuria ,urgency, or frequency. She says she had been constipated for a couple of days prior to the onset of this pain ,then last evening had a couple of loose bowel movements. No melena or hematochezia. She says she is somewhat uncomfortable with walking today.    Review of Systems  Constitutional: Positive for chills and appetite change.  HENT: Negative.   Eyes: Negative.   Respiratory: Negative.   Cardiovascular: Negative.   Gastrointestinal: Positive for abdominal pain and constipation.  Endocrine: Negative.   Genitourinary: Negative.   Musculoskeletal: Negative.   Skin: Negative.   Allergic/Immunologic: Negative.   Neurological: Negative.   Hematological: Negative.   Psychiatric/Behavioral: Negative.    Outpatient Prescriptions Prior to Visit  Medication Sig Dispense Refill  . Aclidinium Bromide (TUDORZA PRESSAIR) 400 MCG/ACT AEPB Inhale 1 puff into the lungs 2 (two) times daily.  1 each  0  . albuterol (PROAIR HFA) 108 (90 BASE) MCG/ACT inhaler Inhale 2 puffs into the lungs 4  (four) times daily as needed.  1 Inhaler  11  . cholecalciferol (VITAMIN D) 1000 UNITS tablet Take 1,000 Units by mouth daily.      . Multiple Vitamins-Minerals (CENTRUM SILVER PO) Take 1 capsule by mouth daily.      . Omega-3 Fatty Acids (FISH OIL) 1000 MG CAPS Take 1 capsule by mouth daily.        . Probiotic Product (PROBIOTIC & ACIDOPHILUS EX ST PO) Take 1 tablet by mouth daily.        . Calcium Citrate-Vitamin D (CITRACAL + D PO) Take by mouth.       No facility-administered medications prior to visit.   Allergies  Allergen Reactions  . Lipitor [Atorvastatin]     REACTION: gait problem/started falling while on medication---pt took herself off medication  . Montelukast Sodium   . Penicillins   . Simvastatin   . Sulfonamide Derivatives    Patient Active Problem List   Diagnosis Date Noted  . Acute exacerbation of COPD with asthma 08/06/2012  . Preventative health care 09/06/2011  . Chronic hoarseness 06/04/2011  . SHINGLES 04/12/2008  . ALLERGIC RHINITIS 10/29/2007  . OSTEOPOROSIS 10/29/2007  . HYPERLIPIDEMIA 01/23/2007  . DIVERTICULOSIS, COLON 01/23/2007  . DEGENERATIVE JOINT DISEASE, HANDS 01/23/2007  . SKIN CANCER, HX OF 01/23/2007  . CARDIAC MURMUR, HX OF 01/23/2007   History  Substance Use Topics  . Smoking status: Never Smoker   . Smokeless tobacco: Never Used  . Alcohol Use: Yes     Comment: [place] [employer],  [employer]IN[employer]IALLY   family history includes Heart failure in her father; Rheum arthritis in her paternal grandmother.     Objective:   Physical Exam well-developed older white female in no acute distress. Blood pressure 120/66 pulse 68 temp 98 4 height 5 foot 5 weight 109. HEENT ;nontraumatic normocephalic EOMI PERRLA sclera anicteric, Supple ;no JVD, Cardiovascula;r regular rate and rhythm with S1-S2 no murmur or gallop, Pulmonary ;clear bilaterally, Abdomen; soft bowel sounds are present she is quite tender in the left lower quadrant and suprapubic area no  guarding positive early rebound no palpable mass or hepatosplenomegaly she's nondistended, Rectal ;exam not done, Extremities; no clubbing cyanosis or edema skin warm and dry, Psych; mood and affect appropriate        Assessment & Plan:  #8  78 year old female with acute lower abdominal pain and chills x24 hours. Suspect she has acute diverticulitis. #2 history of asthma  #3 osteoporosis  Plan; clear liquid diet, home to rest Start Cipro 500 mg by mouth twice daily x14 days, and Flagyl 500 mg by mouth twice daily x14 days. Patient is instructed to get the antibiotics this evening Offered an analgesic which she declined. She will use Tylenol as needed for abdominal pain Schedule for CT scan of the abdomen and pelvis within the next 24 hours CBC with differential and  BMET today Patient is instructed that if her pain worsens or she develops high fever or  persistent chills that she should proceed to the emergency room and probably would require admission.

## 2013-07-12 NOTE — Telephone Encounter (Signed)
Patient reports terrible constipation, abdominal pain and "shaking" that started last night.  She reports that she had terrible rigors last night they have subsided.  She reports that he abdomen in hard.  She reports that she no longer had the rigors.  She will come in and see Nicoletta Ba PA today at 3:30

## 2013-07-12 NOTE — Patient Instructions (Signed)
Please go to the basement level to have your labs drawn.   We sent prescriptions to [facility].  1. Cipro 2. Flagyl  Take Tylenol for pain and have a clear liquid diet. Home to rest.   You have been scheduled for a CT scan of the abdomen and pelvis at [facility] ([address]---this is in the same building as Press photographer).   You are scheduled on Wednesday, 07-13-2013 at 4:00 pm. You should arrive at 3:45 pm to your appointment time for registration. Please follow the written instructions below on the day of your exam:  WARNING: IF YOU ARE ALLERGIC TO IODINE/X-RAY DYE, PLEASE NOTIFY RADIOLOGY IMMEDIATELY AT [phone]! YOU WILL BE GIVEN A 13 HOUR PREMEDICATION PREP.  1) Do not eat or drink anything after noon (4 hours prior to your test) 2) You have been given 2 bottles of oral contrast to drink. The solution may taste  better if refrigerated, but do NOT add ice or any other liquid to this solution. Shake  well before drinking.    Drink 1 bottle of contrast @ 2:00 pm (2 hours prior to your exam)  Drink 1 bottle of contrast @ 3:00 pm (1 hour prior to your exam)  You may take any medications as prescribed with a small amount of water except for the following: Metformin, Glucophage, Glucovance, Avandamet, Riomet, Fortamet, Actoplus Met, Janumet, Glumetza or Metaglip. The above medications must be held the day of the exam AND 48 hours after the exam.  The purpose of you drinking the oral contrast is to aid in the visualization of your intestinal tract. The contrast solution may cause some diarrhea. Before your exam is started, you will be given a small amount of fluid to drink. Depending on your individual set of symptoms, you may also receive an intravenous injection of x-ray contrast/dye. Plan on being at [facility] for 30 minutes or long, depending on the type of exam you are having performed.  If you have any questions regarding your exam or if you need to  reschedule, you may call the CT department at [phone] between the hours of 8:00 am and 5:00 pm, Monday-Friday.  ________________________________________________________________________

## 2013-07-13 ENCOUNTER — Ambulatory Visit (INDEPENDENT_AMBULATORY_CARE_PROVIDER_SITE_OTHER)
Admission: RE | Admit: 2013-07-13 | Discharge: 2013-07-13 | Disposition: A | Discharge status: Home / Self Care | Payer: Medicare Other | Source: Ambulatory Visit | Attending: Physician Assistant | Admitting: Physician Assistant

## 2013-07-13 DIAGNOSIS — R1032 Left lower quadrant pain: Secondary | ICD-10-CM

## 2013-07-13 LAB — BASIC METABOLIC PANEL
BUN: 21 mg/dL (ref 6–23)
CO2: 25 meq/L (ref 19–32)
Calcium: 9.4 mg/dL (ref 8.4–10.5)
Chloride: 102 mEq/L (ref 96–112)
Creatinine, Ser: 0.8 mg/dL (ref 0.4–1.2)
GFR: 73.1 mL/min (ref >60.00)
Glucose, Bld: 105 mg/dL — ABNORMAL HIGH (ref 70–99)
Potassium: 4 mEq/L (ref 3.5–5.1)
SODIUM: 136 meq/L (ref 135–145)

## 2013-07-13 LAB — TSH: TSH: 1.04 u[IU]/mL (ref 0.35–5.50)

## 2013-07-13 MED ORDER — IOHEXOL 300 MG/ML  SOLN
100.0000 mL | Freq: Once | INTRAMUSCULAR | Status: AC | PRN
  Administered 2013-07-13: 100 mL via INTRAVENOUS

## 2013-07-15 ENCOUNTER — Telehealth: Payer: Self-pay | Admitting: Physician Assistant

## 2013-07-15 NOTE — Telephone Encounter (Signed)
Spoke with patient and went over results again.

## 2013-07-18 ENCOUNTER — Telehealth: Payer: Self-pay | Admitting: Physician Assistant

## 2013-07-18 DIAGNOSIS — R197 Diarrhea, unspecified: Secondary | ICD-10-CM

## 2013-07-18 NOTE — Telephone Encounter (Signed)
Obtain C diff pcr

## 2013-07-18 NOTE — Telephone Encounter (Signed)
Patient notified. She will come and get stool kit from lab.

## 2013-07-18 NOTE — Telephone Encounter (Signed)
Patient had CT last week(07/13/13) negative for diverticulitis. She had diarrhea after CT but got better. Now diarrhea is back. Up 3 times last night and has had diarrhea 4 times today. Rectum is sore. Denies fever, cramping. Patient stopped taking the Cipro and Flagyl that Amy ordered last week. She is asking if she can take Imodium.  Also asking if she should restart the Cipro and Flagyl that she stopped. Please, advise.

## 2013-07-19 ENCOUNTER — Telehealth: Payer: Self-pay | Admitting: Physician Assistant

## 2013-07-19 ENCOUNTER — Other Ambulatory Visit: Payer: Medicare Other

## 2013-07-19 DIAGNOSIS — R197 Diarrhea, unspecified: Secondary | ICD-10-CM

## 2013-07-19 NOTE — Telephone Encounter (Signed)
Spoke with patient and she states she did leave a stool sample. Spoke with lab and they have the sample and will send it out.

## 2013-07-20 ENCOUNTER — Telehealth: Payer: Self-pay | Admitting: Physician Assistant

## 2013-07-20 LAB — CLOSTRIDIUM DIFFICILE BY PCR: Toxigenic C. Difficile by PCR: NOT DETECTED

## 2013-07-20 NOTE — Telephone Encounter (Signed)
Patient given results. She states the diarrhea has decreased.

## 2013-09-02 ENCOUNTER — Other Ambulatory Visit: Payer: Self-pay | Admitting: Internal Medicine

## 2013-09-02 ENCOUNTER — Ambulatory Visit (HOSPITAL_COMMUNITY)
Admission: RE | Admit: 2013-09-02 | Discharge: 2013-09-02 | Disposition: A | Discharge status: Home / Self Care | Payer: Medicare Other | Source: Ambulatory Visit | Attending: Internal Medicine | Admitting: Internal Medicine

## 2013-09-02 DIAGNOSIS — Z1231 Encounter for screening mammogram for malignant neoplasm of breast: Secondary | ICD-10-CM

## 2013-09-06 ENCOUNTER — Ambulatory Visit: Payer: Medicare Other | Admitting: Internal Medicine

## 2013-09-14 ENCOUNTER — Encounter: Payer: Medicare Other | Admitting: Internal Medicine

## 2013-09-20 ENCOUNTER — Ambulatory Visit (INDEPENDENT_AMBULATORY_CARE_PROVIDER_SITE_OTHER): Payer: Medicare Other | Admitting: Internal Medicine

## 2013-09-20 ENCOUNTER — Encounter: Payer: Self-pay | Admitting: Internal Medicine

## 2013-09-20 VITALS — BP 110/80 | HR 86 | Temp 97.4°F | Wt 107.5 lb

## 2013-09-20 DIAGNOSIS — Z0000 Encounter for general adult medical examination without abnormal findings: Secondary | ICD-10-CM

## 2013-09-20 DIAGNOSIS — Z136 Encounter for screening for cardiovascular disorders: Secondary | ICD-10-CM

## 2013-09-20 DIAGNOSIS — Z23 Encounter for immunization: Secondary | ICD-10-CM

## 2013-09-20 NOTE — Progress Notes (Signed)
Pre visit review using our clinic review tool, if applicable. No additional management support is needed unless otherwise documented below in the visit note. 

## 2013-09-20 NOTE — Assessment & Plan Note (Signed)

## 2013-09-20 NOTE — Progress Notes (Signed)
Subjective:    Patient ID: Candace Baldwin, female    DOB: 11-29-1935, 78 y.o.   MRN: [id number]  HPI  Here for wellness and f/u;  Overall doing ok;  Pt denies CP, worsening SOB, DOE, wheezing, orthopnea, PND, worsening LE edema, palpitations, dizziness or syncope.  Pt denies neurological change such as new headache, facial or extremity weakness.  Pt denies polydipsia, polyuria, or low sugar symptoms. Pt states overall good compliance with treatment and medications, good tolerability, and has been trying to follow lower cholesterol diet.  Pt denies worsening depressive symptoms, suicidal ideation or panic. No fever, night sweats, wt loss, loss of appetite, or other constitutional symptoms.  Pt states good ability with ADL's, has low fall risk, home safety reviewed and adequate, no other significant changes in hearing or vision, and only occasionally active with exercise.  Has ongoing hair loss for 2 yrs, ? Stress related, lost quite a bit of money on the stock market 2 yrs ago. Due for prevnar Past Medical History  Diagnosis Date  . Skin cancer   . DJD (degenerative joint disease)   . Diverticulosis   . Heart murmur   . Hyperlipidemia   . Osteoporosis   . Allergic rhinitis   . Shingles   . HYPERLIPIDEMIA 01/23/2007    Qualifier: Diagnosis of  By: Jenny Reichmann MD, Hunt Oris   . ALLERGIC RHINITIS 10/29/2007    Qualifier: Diagnosis of  By: Jenny Reichmann MD, Hunt Oris   . SKIN CANCER, HX OF 01/23/2007    Qualifier: Diagnosis of  By: Jenny Reichmann MD, [place] 10/29/2007    Qualifier: Diagnosis of  By: Jenny Reichmann MD, Hunt Oris   . DIVERTICULOSIS, COLON 01/23/2007    Qualifier: Diagnosis of  By: Jenny Reichmann MD, [place], HANDS 01/23/2007    Qualifier: Diagnosis of  By: Jenny Reichmann MD, Hunt Oris   . Chronic hoarseness 06/04/2011    Onset winter of 2011-2012    Past Surgical History  Procedure Laterality Date  . Vaginal hysterectomy  1990  . Mohs surgery      Nose  . Cataract extraction, bilateral    . Foot  surgery  2011    Bunion with prolonged recovery    reports that she has never smoked. She has never used smokeless tobacco. She reports that she drinks alcohol. She reports that she does not use illicit drugs. family history includes Heart failure in her father; Rheum arthritis in her paternal grandmother. Allergies  Allergen Reactions  . Lipitor [Atorvastatin]     REACTION: gait problem/started falling while on medication---pt took herself off medication  . Montelukast Sodium   . Penicillins   . Simvastatin   . Sulfonamide Derivatives    Current Outpatient Prescriptions on File Prior to Visit  Medication Sig Dispense Refill  . Aclidinium Bromide (TUDORZA PRESSAIR) 400 MCG/ACT AEPB Inhale 1 puff into the lungs 2 (two) times daily.  1 each  0  . albuterol (PROAIR HFA) 108 (90 BASE) MCG/ACT inhaler Inhale 2 puffs into the lungs 4 (four) times daily as needed.  1 Inhaler  11  . cholecalciferol (VITAMIN D) 1000 UNITS tablet Take 1,000 Units by mouth daily.      . Multiple Vitamins-Minerals (CENTRUM SILVER PO) Take 1 capsule by mouth daily.      . Omega-3 Fatty Acids (FISH OIL) 1000 MG CAPS Take 1 capsule by mouth daily.        . Probiotic Product (PROBIOTIC &  ACIDOPHILUS EX ST PO) Take 1 tablet by mouth daily.        . [DISCONTINUED] Calcium Carb-Vit D-C-E-Mineral (OS-CAL ULTRA) 600 MG TABS Take 1 tablet by mouth daily.        . [DISCONTINUED] fexofenadine (ALLEGRA) 180 MG tablet Take 180 mg by mouth daily as needed.        No current facility-administered medications on file prior to visit.   Review of Systems Constitutional: Negative for diaphoresis, activity change, appetite change or unexpected weight change.  HENT: Negative for hearing loss, ear pain, facial swelling, mouth sores and neck stiffness.   Eyes: Negative for pain, redness and visual disturbance.  Respiratory: Negative for shortness of breath and wheezing.   Cardiovascular: Negative for chest pain and palpitations.    Gastrointestinal: Negative for diarrhea, blood in stool, abdominal distention or other pain Genitourinary: Negative for hematuria, flank pain or change in urine volume.  Musculoskeletal: Negative for myalgias and joint swelling.  Skin: Negative for color change and wound.  Neurological: Negative for syncope and numbness. other than noted Hematological: Negative for adenopathy.  Psychiatric/Behavioral: Negative for hallucinations, self-injury, decreased concentration and agitation.      Objective:   Physical Exam BP 110/80  Pulse 86  Temp(Src) 97.4 F (36.3 C) (Oral)  Wt 107 lb 8 oz (48.762 kg)  SpO2 98% VS noted,  Constitutional: Pt is oriented to person, place, and time. Appears well-developed and well-nourished.  Head: Normocephalic and atraumatic.  Right Ear: External ear normal.  Left Ear: External ear normal.  Nose: Nose normal.  Mouth/Throat: Oropharynx is clear and moist.  Eyes: Conjunctivae and EOM are normal. Pupils are equal, round, and reactive to light.  Neck: Normal range of motion. Neck supple. No JVD present. No tracheal deviation present.  Cardiovascular: Normal rate, regular rhythm, normal heart sounds and intact distal pulses.   Pulmonary/Chest: Effort normal and breath sounds normal.  Abdominal: Soft. Bowel sounds are normal. There is no tenderness. No HSM  Musculoskeletal: Normal range of motion. Exhibits no edema.  Lymphadenopathy:  Has no cervical adenopathy.  Neurological: Pt is alert and oriented to person, place, and time. Pt has normal reflexes. No cranial nerve deficit.  Skin: Skin is warm and dry. No rash noted.  Psychiatric:  Has  normal mood and affect. Behavior is normal.     Assessment & Plan:

## 2013-09-20 NOTE — Addendum Note (Signed)
Addended by: Sharon Seller B on: 09/20/2013 11:54 AM   Modules accepted: Orders

## 2013-09-20 NOTE — Patient Instructions (Signed)
You had the new Prevnar pneumonia shot today  Your EKG today was OK  Your lab work from Jan 2015 was good  Please continue all other medications as before, and refills have been done if requested. Please have the pharmacy call with any other refills you may need.  Please continue your efforts at being more active, low cholesterol diet, and weight control. You are otherwise up to date with prevention measures today.  Please return in 1 year for your yearly visit, or sooner if needed, with Lab testing done 3-5 days before

## 2014-08-31 ENCOUNTER — Ambulatory Visit: Payer: Medicare Other | Admitting: Internal Medicine

## 2014-09-29 ENCOUNTER — Encounter: Payer: Medicare Other | Admitting: Internal Medicine

## 2014-11-23 ENCOUNTER — Encounter: Payer: Self-pay | Admitting: Gastroenterology

## 2015-01-09 ENCOUNTER — Other Ambulatory Visit (HOSPITAL_COMMUNITY): Payer: Self-pay | Admitting: Family Medicine

## 2015-01-09 DIAGNOSIS — Z1231 Encounter for screening mammogram for malignant neoplasm of breast: Secondary | ICD-10-CM

## 2015-01-10 ENCOUNTER — Ambulatory Visit (HOSPITAL_COMMUNITY)
Admission: RE | Admit: 2015-01-10 | Discharge: 2015-01-10 | Disposition: A | Discharge status: Home / Self Care | Payer: Medicare Other | Source: Ambulatory Visit | Attending: Family Medicine | Admitting: Family Medicine

## 2015-01-10 DIAGNOSIS — Z1231 Encounter for screening mammogram for malignant neoplasm of breast: Secondary | ICD-10-CM | POA: Insufficient documentation

## 2015-05-29 ENCOUNTER — Encounter (HOSPITAL_COMMUNITY): Payer: Self-pay | Admitting: Emergency Medicine

## 2015-05-29 ENCOUNTER — Observation Stay (HOSPITAL_COMMUNITY)
Admission: EM | Admit: 2015-05-29 | Discharge: 2015-05-29 | Disposition: A | Discharge status: Home / Self Care | Payer: Medicare Other | Attending: Family Medicine | Admitting: Family Medicine

## 2015-05-29 ENCOUNTER — Emergency Department (HOSPITAL_COMMUNITY): Payer: Medicare Other

## 2015-05-29 DIAGNOSIS — K5792 Diverticulitis of intestine, part unspecified, without perforation or abscess without bleeding: Principal | ICD-10-CM | POA: Insufficient documentation

## 2015-05-29 DIAGNOSIS — E785 Hyperlipidemia, unspecified: Secondary | ICD-10-CM | POA: Diagnosis not present

## 2015-05-29 DIAGNOSIS — Z79899 Other long term (current) drug therapy: Secondary | ICD-10-CM | POA: Diagnosis not present

## 2015-05-29 DIAGNOSIS — I771 Stricture of artery: Secondary | ICD-10-CM

## 2015-05-29 DIAGNOSIS — R739 Hyperglycemia, unspecified: Secondary | ICD-10-CM | POA: Diagnosis not present

## 2015-05-29 DIAGNOSIS — R112 Nausea with vomiting, unspecified: Secondary | ICD-10-CM | POA: Insufficient documentation

## 2015-05-29 DIAGNOSIS — K551 Chronic vascular disorders of intestine: Secondary | ICD-10-CM

## 2015-05-29 DIAGNOSIS — E860 Dehydration: Secondary | ICD-10-CM | POA: Diagnosis not present

## 2015-05-29 DIAGNOSIS — Z880 Allergy status to penicillin: Secondary | ICD-10-CM | POA: Insufficient documentation

## 2015-05-29 DIAGNOSIS — K5712 Diverticulitis of small intestine without perforation or abscess without bleeding: Secondary | ICD-10-CM

## 2015-05-29 DIAGNOSIS — Z85828 Personal history of other malignant neoplasm of skin: Secondary | ICD-10-CM | POA: Diagnosis not present

## 2015-05-29 LAB — URINALYSIS, ROUTINE W REFLEX MICROSCOPIC
Bilirubin Urine: NEGATIVE
GLUCOSE, UA: NEGATIVE mg/dL
Hgb urine dipstick: NEGATIVE
Ketones, ur: NEGATIVE mg/dL
Nitrite: NEGATIVE
PH: 7.5 (ref 5.0–8.0)
Protein, ur: NEGATIVE mg/dL
SPECIFIC GRAVITY, URINE: 1.021 (ref 1.005–1.030)

## 2015-05-29 LAB — CBC
HEMATOCRIT: 44.7 % (ref 36.0–46.0)
Hemoglobin: 14.7 g/dL (ref 12.0–15.0)
MCH: 31.1 pg (ref 26.0–34.0)
MCHC: 32.9 g/dL (ref 30.0–36.0)
MCV: 94.7 fL (ref 78.0–100.0)
Platelets: 232 10*3/uL (ref 150–400)
RBC: 4.72 MIL/uL (ref 3.87–5.11)
RDW: 14.3 % (ref 11.5–15.5)
WBC: 15.5 10*3/uL — AB (ref 4.0–10.5)

## 2015-05-29 LAB — COMPREHENSIVE METABOLIC PANEL
ALK PHOS: 92 U/L (ref 38–126)
ALT: 22 U/L (ref 14–54)
AST: 50 U/L — AB (ref 15–41)
Albumin: 4.1 g/dL (ref 3.5–5.0)
Anion gap: 7 (ref 5–15)
BUN: 22 mg/dL — AB (ref 6–20)
CALCIUM: 9.8 mg/dL (ref 8.9–10.3)
CHLORIDE: 103 mmol/L (ref 101–111)
CO2: 29 mmol/L (ref 22–32)
CREATININE: 0.67 mg/dL (ref 0.44–1.00)
GFR calc Af Amer: >60 mL/min (ref >60)
Glucose, Bld: 163 mg/dL — ABNORMAL HIGH (ref 65–99)
Potassium: 4.3 mmol/L (ref 3.5–5.1)
Sodium: 139 mmol/L (ref 135–145)
Total Bilirubin: 0.6 mg/dL (ref 0.3–1.2)
Total Protein: 7.1 g/dL (ref 6.5–8.1)

## 2015-05-29 LAB — URINE MICROSCOPIC-ADD ON

## 2015-05-29 LAB — LIPASE, BLOOD: LIPASE: 42 U/L (ref 11–51)

## 2015-05-29 MED ORDER — ONDANSETRON HCL 4 MG PO TABS: 4.0000 mg | ORAL_TABLET | Freq: Four times a day (QID) | ORAL | Status: DC

## 2015-05-29 MED ORDER — MORPHINE SULFATE (PF) 4 MG/ML IV SOLN
4.0000 mg | Freq: Once | INTRAVENOUS | Status: AC
  Administered 2015-05-29: 4 mg via INTRAVENOUS
  Filled 2015-05-29: qty 1

## 2015-05-29 MED ORDER — CIPROFLOXACIN HCL 500 MG PO TABS: 500.0000 mg | ORAL_TABLET | Freq: Two times a day (BID) | ORAL | Status: DC

## 2015-05-29 MED ORDER — CIPROFLOXACIN IN D5W 400 MG/200ML IV SOLN
400.0000 mg | Freq: Once | INTRAVENOUS | Status: AC
  Administered 2015-05-29: 400 mg via INTRAVENOUS
  Filled 2015-05-29: qty 200

## 2015-05-29 MED ORDER — BARIUM SULFATE 2.1 % PO SUSP
ORAL | Status: AC
  Filled 2015-05-29: qty 2

## 2015-05-29 MED ORDER — SODIUM CHLORIDE 0.9 % IV BOLUS (SEPSIS)
1000.0000 mL | Freq: Once | INTRAVENOUS | Status: AC
  Administered 2015-05-29: 1000 mL via INTRAVENOUS

## 2015-05-29 MED ORDER — IOHEXOL 300 MG/ML  SOLN
80.0000 mL | Freq: Once | INTRAMUSCULAR | Status: AC | PRN
  Administered 2015-05-29: 80 mL via INTRAVENOUS

## 2015-05-29 MED ORDER — SODIUM CHLORIDE 0.9 % IV BOLUS (SEPSIS)
500.0000 mL | INTRAVENOUS | Status: AC
  Administered 2015-05-29: 500 mL via INTRAVENOUS

## 2015-05-29 MED ORDER — ONDANSETRON HCL 4 MG/2ML IJ SOLN
4.0000 mg | Freq: Once | INTRAMUSCULAR | Status: AC
  Administered 2015-05-29: 4 mg via INTRAVENOUS
  Filled 2015-05-29: qty 2

## 2015-05-29 MED ORDER — METRONIDAZOLE IN NACL 5-0.79 MG/ML-% IV SOLN
500.0000 mg | Freq: Once | INTRAVENOUS | Status: AC
  Administered 2015-05-29: 500 mg via INTRAVENOUS
  Filled 2015-05-29: qty 100

## 2015-05-29 MED ORDER — METRONIDAZOLE 500 MG PO TABS: 500.0000 mg | ORAL_TABLET | Freq: Two times a day (BID) | ORAL | Status: DC

## 2015-05-29 NOTE — ED Notes (Signed)
NAD at this time. Pt is stable and going home.  

## 2015-05-29 NOTE — Discharge Instructions (Signed)
Diverticulitis °Diverticulitis is inflammation or infection of small pouches in your colon that form when you have a condition called diverticulosis. The pouches in your colon are called diverticula. Your colon, or large intestine, is where water is absorbed and stool is formed. °Complications of diverticulitis can include: °· Bleeding. °· Severe infection. °· Severe pain. °· Perforation of your colon. °· Obstruction of your colon. °CAUSES  °Diverticulitis is caused by bacteria. °Diverticulitis happens when stool becomes trapped in diverticula. This allows bacteria to grow in the diverticula, which can lead to inflammation and infection. °RISK FACTORS °People with diverticulosis are at risk for diverticulitis. Eating a diet that does not include enough fiber from fruits and vegetables may make diverticulitis more likely to develop. °SYMPTOMS  °Symptoms of diverticulitis may include: °· Abdominal pain and tenderness. The pain is normally located on the left side of the abdomen, but may occur in other areas. °· Fever and chills. °· Bloating. °· Cramping. °· Nausea. °· Vomiting. °· Constipation. °· Diarrhea. °· Blood in your stool. °DIAGNOSIS  °Your health care provider will ask you about your medical history and do a physical exam. You may need to have tests done because many medical conditions can cause the same symptoms as diverticulitis. Tests may include: °· Blood tests. °· Urine tests. °· Imaging tests of the abdomen, including X-rays and CT scans. °When your condition is under control, your health care provider may recommend that you have a colonoscopy. A colonoscopy can show how severe your diverticula are and whether something else is causing your symptoms. °TREATMENT  °Most cases of diverticulitis are mild and can be treated at home. Treatment may include: °· Taking over-the-counter pain medicines. °· Following a clear liquid diet. °· Taking antibiotic medicines by mouth for 7-10 days. °More severe cases may  be treated at a hospital. Treatment may include: °· Not eating or drinking. °· Taking prescription pain medicine. °· Receiving antibiotic medicines through an IV tube. °· Receiving fluids and nutrition through an IV tube. °· Surgery. °HOME CARE INSTRUCTIONS  °· Follow your health care provider's instructions carefully. °· Follow a full liquid diet or other diet as directed by your health care provider. After your symptoms improve, your health care provider may tell you to change your diet. He or she may recommend you eat a high-fiber diet. Fruits and vegetables are good sources of fiber. Fiber makes it easier to pass stool. °· Take fiber supplements or probiotics as directed by your health care provider. °· Only take medicines as directed by your health care provider. °· Keep all your follow-up appointments. °SEEK MEDICAL CARE IF:  °· Your pain does not improve. °· You have a hard time eating food. °· Your bowel movements do not return to normal. °SEEK IMMEDIATE MEDICAL CARE IF:  °· Your pain becomes worse. °· Your symptoms do not get better. °· Your symptoms suddenly get worse. °· You have a fever. °· You have repeated vomiting. °· You have bloody or black, tarry stools. °MAKE SURE YOU:  °· Understand these instructions. °· Will watch your condition. °· Will get help right away if you are not doing well or get worse. °  °This information is not intended to replace advice given to you by your health care provider. Make sure you discuss any questions you have with your health care provider. °  °Document Released: 03/19/2005 Document Revised: 06/14/2013 Document Reviewed: 05/04/2013 °[employer] Interactive Patient Education ©2016 [employer] ° °

## 2015-05-29 NOTE — Consult Note (Signed)
[facility] Medical Consultation  Danial Flippin [id number] DOB: Aug 22, 1935 DOA: 05/29/2015 PCP: Anthoney Harada, MD   Requesting physician: PA Marlowe Aschoff Date of consultation: 05/29/15 Reason for consultation: Diverticulitis  Impression/Recommendations Principal Problem:   Diverticulitis Active Problems:   Dehydration   Nausea and vomiting   Superior mesenteric artery stenosis (HCC)   Hyperglycemia  Diverticulitis: Diverticulitis noted on abdominal CT at the distal transverse, descending and sigmoid colons. No mention of abscess, free air, or perforation. No other acute abdominal process noted. After receiving 1 L normal saline bolus and metronidazole and ciprofloxacin patient states that Candace Baldwin feels like a whole new wioman and feels back to her normal self." Discussed treatment options with patient involving in patient observation versus treatment at home and patient stating that Candace Baldwin wants to go home and sleep in her own bed as Candace Baldwin has made such a rapid improvement in her condition. Abdomen is no longer significantly tender and without distention. At this time I feel patient is safe to be treated at home. Patient will call her primary care office today for follow-up tomorrow. - Patient stable and safe for discharge - Risk benefits of treatment options discussed - Discharge with Zofran, metronidazole, ciprofloxacin - Patient will follow-up with PCP in one day  Dehydration: Patient is received 1 L normal saline bolus as well as 500 mL of normal saline with IV antibiotics. Recommending 500 mL additional normal saline bolus prior to discharge.   Nausea/vomiting: Markedly improved. Zofran as above  SMA vascular disease: No evidence of occlusion or acute ischemia.  - Follow-up primary care physician  Hyperglycemia: likely secondary to acute illness.  - f/u PCP w/ A1c.   Chief Complaint: Abd pain, n/v/d  HPI:  Acute onset abdominal pain. Diffuse though more focal  on the left. Constant with waxing and waning nature. Associated with abdominal distention, vomiting, diarrhea. No previous abdominal surgeries. Denies fevers, chills, dysuria, back pain, rash, hematochezia, hematemesis. Gas-X without improvement. Nothing makes her symptoms worse. There is little oral intake since onset of symptoms. Denies fevers, dysuria, frequency, back pain, chills, neck stiffness, headache, chest pain, palpitations, shortness of breath, focal weakness or loss of sensation, hematochezia, hematemesis, shortness of breath, cough, sore throat. No recent antibiotics.  Review of Systems:  Per HPI w/ all other systems negative.   Past Medical History  Diagnosis Date  . Skin cancer   . DJD (degenerative joint disease)   . Diverticulosis   . Heart murmur   . Hyperlipidemia   . Osteoporosis   . Allergic rhinitis   . Shingles   . HYPERLIPIDEMIA 01/23/2007    Qualifier: Diagnosis of  By: Jenny Reichmann MD, Hunt Oris   . ALLERGIC RHINITIS 10/29/2007    Qualifier: Diagnosis of  By: Jenny Reichmann MD, Hunt Oris   . SKIN CANCER, HX OF 01/23/2007    Qualifier: Diagnosis of  By: Jenny Reichmann MD, [place] 10/29/2007    Qualifier: Diagnosis of  By: Jenny Reichmann MD, Hunt Oris   . DIVERTICULOSIS, COLON 01/23/2007    Qualifier: Diagnosis of  By: Jenny Reichmann MD, [place], HANDS 01/23/2007    Qualifier: Diagnosis of  By: Jenny Reichmann MD, Hunt Oris   . Chronic hoarseness 06/04/2011    Onset winter of 2011-2012    Past Surgical History  Procedure Laterality Date  . Vaginal hysterectomy  1990  . Mohs surgery      Nose  . Cataract extraction, bilateral    . Foot  surgery  2011    Bunion with prolonged recovery   Social History:  reports that Candace Baldwin has never smoked. Candace Baldwin has never used smokeless tobacco. Candace Baldwin reports that Candace Baldwin drinks alcohol. Candace Baldwin reports that Candace Baldwin does not use illicit drugs.  Allergies  Allergen Reactions  . Lipitor [Atorvastatin]     REACTION: gait problem/started falling while on medication---pt  took herself off medication  . Montelukast Sodium   . Penicillins   . Simvastatin   . Sulfonamide Derivatives    Family History  Problem Relation Age of Onset  . Heart failure Father   . Rheum arthritis Paternal Grandmother     Aunt x 2    Prior to Admission medications   Medication Sig Start Date End Date Taking? Authorizing Provider  Aclidinium Bromide (TUDORZA PRESSAIR) 400 MCG/ACT AEPB Inhale 1 puff into the lungs 2 (two) times daily. Patient not taking: Reported on 05/29/2015 07/29/12   Deneise Lever, MD  albuterol (PROAIR HFA) 108 (90 BASE) MCG/ACT inhaler Inhale 2 puffs into the lungs 4 (four) times daily as needed. Patient not taking: Reported on 05/29/2015 07/30/12   Deneise Lever, MD   Physical Exam: Blood pressure 118/75, pulse 87, temperature 98.2 F (36.8 C), temperature source Oral, resp. rate 16, height 5\' 6"  (1.676 m), weight 47.628 kg (105 lb), SpO2 95 %. Filed Vitals:   05/29/15 1130 05/29/15 1201  BP: 115/65 118/75  Pulse: 83 87  Temp:  98.2 F (36.8 C)  Resp:  16     General:  Elderly but well-appearing, thin.  Eyes: EOMI, PERRLA  ENT: Dry mucous membranes  Neck: No masses or thyromegaly, FROM  Cardiovascular: Regular rate and rhythm, no murmurs rubs or gallops, 2+ peripheral pulses   Respiratory: Clear to auscultation bilaterally, normal effort   Abdomen: Normoactive bowel sounds, nondistended, mild diffuse intermittent left upper lower quadrant tenderness to deep palpation   Skin: No rash or skin tears   Musculoskeletal: 5 out of 5 strength symmetrically in upper and lower extremities, no effusions   Psychiatric: Alert and oriented 3. Able answer questions appropriately. Normal conversation   Neurologic: Cranial nerves II through XII grossly intact, moves all extremities and coordinated fashion  Labs on Admission:  Basic Metabolic Panel:  Recent Labs Lab 05/29/15 0352  NA 139  K 4.3  CL 103  CO2 29  GLUCOSE 163*  BUN 22*   CREATININE 0.67  CALCIUM 9.8   Liver Function Tests:  Recent Labs Lab 05/29/15 0352  AST 50*  ALT 22  ALKPHOS 92  BILITOT 0.6  PROT 7.1  ALBUMIN 4.1    Recent Labs Lab 05/29/15 0352  LIPASE 42   No results for input(s): AMMONIA in the last 168 hours. CBC:  Recent Labs Lab 05/29/15 0352  WBC 15.5*  HGB 14.7  HCT 44.7  MCV 94.7  PLT 232   Cardiac Enzymes: No results for input(s): CKTOTAL, CKMB, CKMBINDEX, TROPONINI in the last 168 hours. BNP: Invalid input(s): POCBNP CBG: No results for input(s): GLUCAP in the last 168 hours.  Radiological Exams on Admission: Ct Abdomen Pelvis W Contrast  05/29/2015  CLINICAL DATA:  Left lower quadrant abdominal pain. Cramping to the entire lower abdomen. Abdominal distention. Vomiting last evening. Personal history of left lower quadrant diverticulitis. EXAM: CT ABDOMEN AND PELVIS WITH CONTRAST TECHNIQUE: Multidetector CT imaging of the abdomen and pelvis was performed using the standard protocol following bolus administration of intravenous contrast. CONTRAST:  80 mL Omnipaque 300 COMPARISON:  None. FINDINGS: Lower  chest: The lung bases are clear without focal nodule, mass, or airspace disease. The heart size is normal. No significant pericardial or pleural effusion is present. Hepatobiliary: Scattered punctate calcifications suggest prior granulomatous disease. A tiny cysts is present in the left lobe of the liver on image 16. The gallbladder is within normal limits. The common bile duct is within normal limits for age. Pancreas: The pancreas is within normal limits. Spleen: Unremarkable Adrenals/Urinary Tract: The adrenal glands are normal bilaterally. A 5 mm nonobstructing stone is present at the lower pole of the left kidney. A 5 mm cyst is present at the lower pole of the left kidney. The right kidney is slightly under rotated. There is a punctate nonobstructing stone in the midportion of the collecting system. The ureters are within  normal limits bilaterally. The urinary bladder is unremarkable. Stomach/Bowel: The stomach is moderately distended. No mechanical eyelid obstruction is evident. The duodenum contains high-density contrast proximal to the superior mesenteric artery more than distal. There is some oral contrast within the small bowel. A focal caliber change is present in the mid abdomen, best seen on image 57 of series 5 (coronal). High-density contrast is seen just proximal to this transition point. There is no mass lesion or focal obstruction. More distal small bowel is fluid containing. The appendix and cecum are within normal limits. Extensive diverticular changes are present within the descending and sigmoid colon. There is no inflammatory change about the distal sigmoid colon. There is some mild inflammation at the level of the splenic flexure which could represent acute diverticulitis. Some diverticular changes present in the distal transverse colon as well. Vascular/Lymphatic: Atherosclerotic calcifications are present in the aorta and branch vessels without aneurysm. No significant adenopathy is present. Reproductive: Hysterectomy is noted. The adnexa are within normal limits for age. Other: No significant free fluid is present. There is no evidence for bowel perforation or focal abscess. Musculoskeletal: Focal levoconvex curvature of the lumbar spine is centered at L2-3. Asymmetric right-sided endplate changes and subchondral cystic changes noted. Vertebral body heights are maintained. There slight retrolisthesis at L2-3, degenerative. Minimal degenerative anterolisthesis is present at L4-5. IMPRESSION: 1. Diverticular changes within the distal transverse colon, descending colon, and sigmoid colon. There is focal inflammatory change near the splenic flexure which could represent early diverticulitis. No other significant inflammatory changes are associated with the diverticular disease. 2. Focal change in bowel caliber in  the mid abdomen without evidence for focal mass lesion or obstruction. This may represent an ileus. Internal herniation is considered less likely. 3. Focal contrast at the level of the superior mesenteric artery may reflect some narrowing about the third portion of the duodenum. 4. Degenerative changes in the lumbar spine as described. Electronically Signed   By: San Morelle M.D.   On: 05/29/2015 10:13     MERRELL, DAVID J [facility]   If 7PM-7AM, please contact night-coverage [email or web address] Password [email or web address] 05/29/2015, 12:04 PM

## 2015-05-29 NOTE — ED Notes (Signed)
Pt. reports left abdominal pain with emesis and loose stools onset this evening after eating supper  , denies fever or chills.

## 2015-05-29 NOTE — ED Notes (Signed)
PA at bedside.

## 2015-05-29 NOTE — ED Notes (Signed)
Patient transported to CT 

## 2015-05-29 NOTE — ED Provider Notes (Signed)
CSN: [id number]     Arrival date & time 05/29/15  0317 History   First MD Initiated Contact with Patient 05/29/15 0600     Chief Complaint  Patient presents with  . Abdominal Pain     (Consider location/radiation/quality/duration/timing/severity/associated sxs/prior Treatment) HPI Comments: Patient presents to the emergency department with chief complaint of diffuse abdominal pain. She states that the pain started last night after eating dinner. She reports that it was mostly located on the left side of her abdomen. She reports associated abdominal distention, which has gradually improved. She reports forceful vomiting and loose stools. She denies any prior abdominal surgeries. States that she has never experienced anything like this before. She states that her pain is now moderate. She denies any associated fevers or chills. Denies any dysuria or hematuria. There are no associated aggravating or alleviating factors.  The history is provided by the patient. No language interpreter was used.    Past Medical History  Diagnosis Date  . Skin cancer   . DJD (degenerative joint disease)   . Diverticulosis   . Heart murmur   . Hyperlipidemia   . Osteoporosis   . Allergic rhinitis   . Shingles   . HYPERLIPIDEMIA 01/23/2007    Qualifier: Diagnosis of  By: Jenny Reichmann MD, Hunt Oris   . ALLERGIC RHINITIS 10/29/2007    Qualifier: Diagnosis of  By: Jenny Reichmann MD, Hunt Oris   . SKIN CANCER, HX OF 01/23/2007    Qualifier: Diagnosis of  By: Jenny Reichmann MD, [place] 10/29/2007    Qualifier: Diagnosis of  By: Jenny Reichmann MD, Hunt Oris   . DIVERTICULOSIS, COLON 01/23/2007    Qualifier: Diagnosis of  By: Jenny Reichmann MD, [place], HANDS 01/23/2007    Qualifier: Diagnosis of  By: Jenny Reichmann MD, Hunt Oris   . Chronic hoarseness 06/04/2011    Onset winter of 2011-2012    Past Surgical History  Procedure Laterality Date  . Vaginal hysterectomy  1990  . Mohs surgery      Nose  . Cataract extraction, bilateral     . Foot surgery  2011    Bunion with prolonged recovery   Family History  Problem Relation Age of Onset  . Heart failure Father   . Rheum arthritis Paternal Grandmother     Aunt x 2   Social History  Substance Use Topics  . Smoking status: Never Smoker   . Smokeless tobacco: Never Used  . Alcohol Use: Yes     Comment: OCC GLASS OF WINE, DRINKS SOCIALLY   OB History    No data available     Review of Systems  Constitutional: Negative for fever and chills.  Respiratory: Negative for shortness of breath.   Cardiovascular: Negative for chest pain.  Gastrointestinal: Positive for nausea, vomiting and abdominal pain. Negative for diarrhea and constipation.  Genitourinary: Negative for dysuria.  All other systems reviewed and are negative.     Allergies  Lipitor; Montelukast sodium; Penicillins; Simvastatin; and Sulfonamide derivatives  Home Medications   Prior to Admission medications   Medication Sig Start Date End Date Taking? Authorizing Provider  Aclidinium Bromide (TUDORZA PRESSAIR) 400 MCG/ACT AEPB Inhale 1 puff into the lungs 2 (two) times daily. Patient not taking: Reported on 05/29/2015 07/29/12   Deneise Lever, MD  albuterol (PROAIR HFA) 108 (90 BASE) MCG/ACT inhaler Inhale 2 puffs into the lungs 4 (four) times daily as needed. Patient not taking: Reported on 05/29/2015 07/30/12  Deneise Lever, MD   BP 131/75 mmHg  Pulse 95  Temp(Src) 98.1 F (36.7 C) (Oral)  Resp 16  Ht 5\' 6"  (1.676 m)  Wt 47.628 kg  BMI 16.96 kg/m2  SpO2 99% Physical Exam  Constitutional: She is oriented to person, place, and time. She appears well-developed and well-nourished.  HENT:  Head: Normocephalic and atraumatic.  Eyes: Conjunctivae and EOM are normal. Pupils are equal, round, and reactive to light.  Neck: Normal range of motion. Neck supple.  Cardiovascular: Normal rate and regular rhythm.  Exam reveals no gallop and no friction rub.   No murmur heard. Pulmonary/Chest: Effort  normal and breath sounds normal. No respiratory distress. She has no wheezes. She has no rales. She exhibits no tenderness.  Abdominal: Soft. Bowel sounds are normal. She exhibits no distension and no mass. There is no tenderness. There is no rebound and no guarding.  No focal abdominal tenderness, no RLQ tenderness or pain at McBurney's point, no RUQ tenderness or Murphy's sign, no left-sided abdominal tenderness, no fluid wave, or signs of peritonitis   Musculoskeletal: Normal range of motion. She exhibits no edema or tenderness.  Neurological: She is alert and oriented to person, place, and time.  Skin: Skin is warm and dry.  Psychiatric: She has a normal mood and affect. Her behavior is normal. Judgment and thought content normal.  Nursing note and vitals reviewed.   ED Course  Procedures (including critical care time) Results for orders placed or performed during the hospital encounter of 05/29/15  Lipase, blood  Result Value Ref Range   Lipase 42 11 - 51 U/L  Comprehensive metabolic panel  Result Value Ref Range   Sodium 139 135 - 145 mmol/L   Potassium 4.3 3.5 - 5.1 mmol/L   Chloride 103 101 - 111 mmol/L   CO2 29 22 - 32 mmol/L   Glucose, Bld 163 (H) 65 - 99 mg/dL   BUN 22 (H) 6 - 20 mg/dL   Creatinine, Ser 0.67 0.44 - 1.00 mg/dL   Calcium 9.8 8.9 - 10.3 mg/dL   Total Protein 7.1 6.5 - 8.1 g/dL   Albumin 4.1 3.5 - 5.0 g/dL   AST 50 (H) 15 - 41 U/L   ALT 22 14 - 54 U/L   Alkaline Phosphatase 92 38 - 126 U/L   Total Bilirubin 0.6 0.3 - 1.2 mg/dL   GFR calc non Af Amer >60 >60 mL/min   GFR calc Af Amer >60 >60 mL/min   Anion gap 7 5 - 15  CBC  Result Value Ref Range   WBC 15.5 (H) 4.0 - 10.5 K/uL   RBC 4.72 3.87 - 5.11 MIL/uL   Hemoglobin 14.7 12.0 - 15.0 g/dL   HCT 44.7 36.0 - 46.0 %   MCV 94.7 78.0 - 100.0 fL   MCH 31.1 26.0 - 34.0 pg   MCHC 32.9 30.0 - 36.0 g/dL   RDW 14.3 11.5 - 15.5 %   Platelets 232 150 - 400 K/uL  Urinalysis, Routine w reflex microscopic (not  at [facility])  Result Value Ref Range   Color, Urine YELLOW YELLOW   APPearance CLOUDY (A) CLEAR   Specific Gravity, Urine 1.021 1.005 - 1.030   pH 7.5 5.0 - 8.0   Glucose, UA NEGATIVE NEGATIVE mg/dL   Hgb urine dipstick NEGATIVE NEGATIVE   Bilirubin Urine NEGATIVE NEGATIVE   Ketones, ur NEGATIVE NEGATIVE mg/dL   Protein, ur NEGATIVE NEGATIVE mg/dL   Nitrite NEGATIVE NEGATIVE   Leukocytes,  UA TRACE (A) NEGATIVE  Urine microscopic-add on  Result Value Ref Range   Squamous Epithelial / LPF 0-5 (A) NONE SEEN   WBC, UA 0-5 0 - 5 WBC/hpf   RBC / HPF 0-5 0 - 5 RBC/hpf   Bacteria, UA FEW (A) NONE SEEN   Urine-Other MUCOUS PRESENT    Ct Abdomen Pelvis W Contrast  05/29/2015  CLINICAL DATA:  Left lower quadrant abdominal pain. Cramping to the entire lower abdomen. Abdominal distention. Vomiting last evening. Personal history of left lower quadrant diverticulitis. EXAM: CT ABDOMEN AND PELVIS WITH CONTRAST TECHNIQUE: Multidetector CT imaging of the abdomen and pelvis was performed using the standard protocol following bolus administration of intravenous contrast. CONTRAST:  80 mL Omnipaque 300 COMPARISON:  None. FINDINGS: Lower chest: The lung bases are clear without focal nodule, mass, or airspace disease. The heart size is normal. No significant pericardial or pleural effusion is present. Hepatobiliary: Scattered punctate calcifications suggest prior granulomatous disease. A tiny cysts is present in the left lobe of the liver on image 16. The gallbladder is within normal limits. The common bile duct is within normal limits for age. Pancreas: The pancreas is within normal limits. Spleen: Unremarkable Adrenals/Urinary Tract: The adrenal glands are normal bilaterally. A 5 mm nonobstructing stone is present at the lower pole of the left kidney. A 5 mm cyst is present at the lower pole of the left kidney. The right kidney is slightly under rotated. There is a punctate nonobstructing stone in the midportion of the  collecting system. The ureters are within normal limits bilaterally. The urinary bladder is unremarkable. Stomach/Bowel: The stomach is moderately distended. No mechanical eyelid obstruction is evident. The duodenum contains high-density contrast proximal to the superior mesenteric artery more than distal. There is some oral contrast within the small bowel. A focal caliber change is present in the mid abdomen, best seen on image 57 of series 5 (coronal). High-density contrast is seen just proximal to this transition point. There is no mass lesion or focal obstruction. More distal small bowel is fluid containing. The appendix and cecum are within normal limits. Extensive diverticular changes are present within the descending and sigmoid colon. There is no inflammatory change about the distal sigmoid colon. There is some mild inflammation at the level of the splenic flexure which could represent acute diverticulitis. Some diverticular changes present in the distal transverse colon as well. Vascular/Lymphatic: Atherosclerotic calcifications are present in the aorta and branch vessels without aneurysm. No significant adenopathy is present. Reproductive: Hysterectomy is noted. The adnexa are within normal limits for age. Other: No significant free fluid is present. There is no evidence for bowel perforation or focal abscess. Musculoskeletal: Focal levoconvex curvature of the lumbar spine is centered at L2-3. Asymmetric right-sided endplate changes and subchondral cystic changes noted. Vertebral body heights are maintained. There slight retrolisthesis at L2-3, degenerative. Minimal degenerative anterolisthesis is present at L4-5. IMPRESSION: 1. Diverticular changes within the distal transverse colon, descending colon, and sigmoid colon. There is focal inflammatory change near the splenic flexure which could represent early diverticulitis. No other significant inflammatory changes are associated with the diverticular  disease. 2. Focal change in bowel caliber in the mid abdomen without evidence for focal mass lesion or obstruction. This may represent an ileus. Internal herniation is considered less likely. 3. Focal contrast at the level of the superior mesenteric artery may reflect some narrowing about the third portion of the duodenum. 4. Degenerative changes in the lumbar spine as described. Electronically Signed  By: San Morelle M.D.   On: 05/29/2015 10:13    I have personally reviewed and evaluated these images and lab results as part of my medical decision-making.   EKG Interpretation None      MDM   Final diagnoses:  Diverticulitis of intestine without perforation or abscess without bleeding    Patient with left-sided lower abdominal pain.  Started last night.  Asssociated n/v/d.  Could be related to what she ate, or possibly viral.  However, given age and severity of symptoms, will check CT.  Patient seen by and discussed with Dr. Venora Maples, who agrees with the plan.  CT scans concerning for possible early diverticulitis... Possible ileus.  Patient feels well improved, but concerning findings for early disease on CT.  CT discussed with Dr. Johnney Killian, who advises starting cipro and flagyl and admission.    11:16 AM Appreciate Dr. Marily Memos, for admitting the patient.  Plan for med-surg obs.     12:41 PM Patient seen by and discussed with Dr. Marily Memos, who recommends that the patient be discharged.  Patient reports improvement of her symptoms.  She is tolerating orals.  Appropriate for outpatient trial.  Patient prefers this plan.  Return precautions given.  Will give 537ml NS and prescribe cipro, flagyl, and zofran per Dr. Baker Janus recommendations.  Montine Circle, PA-C 05/29/15 [place], MD 05/29/15 [phone]

## 2015-05-30 MED ORDER — IOHEXOL 300 MG/ML  SOLN
80.0000 mL | Freq: Once | INTRAMUSCULAR | Status: AC | PRN
  Administered 2015-05-29: 80 mL via INTRAVENOUS

## 2016-01-04 IMAGING — CT CT ABD-PELV W/ CM
2 of 5 series · 15 of 46 positions shown, 17 images · IV contrast (Omni 300)
Comparison: None.

CLINICAL DATA: Left lower quadrant abdominal pain. Cramping to the
entire lower abdomen. Abdominal distention. Vomiting last evening.
Personal history of left lower quadrant diverticulitis.

EXAM:
CT ABDOMEN AND PELVIS WITH CONTRAST
TECHNIQUE: Multidetector CT imaging of the abdomen and pelvis was performed
using the standard protocol following bolus administration of
intravenous contrast.
CONTRAST:  80 mL Omnipaque 300

[Series 2: a/p w/ 5mm · axial · 0.59mm/px · z∈[+785,+1160]mm · 12 of 85 slices shown, 14 images]
[im 5/85  soft-tissue]
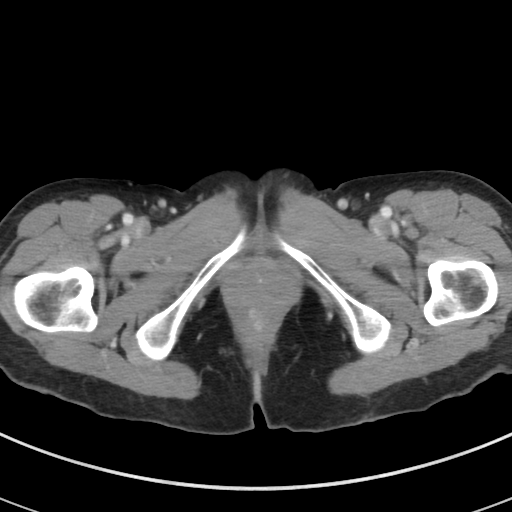
[im 5/85  bone]
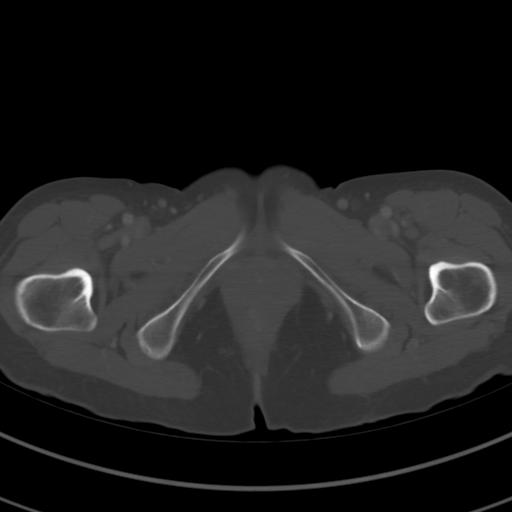
[im 15/85  soft-tissue]
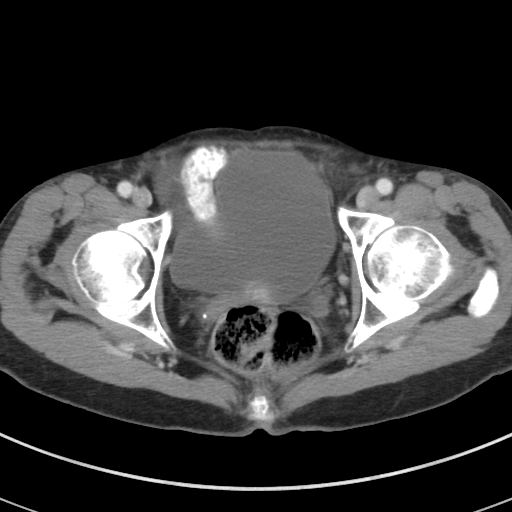
[im 19/85  soft-tissue]
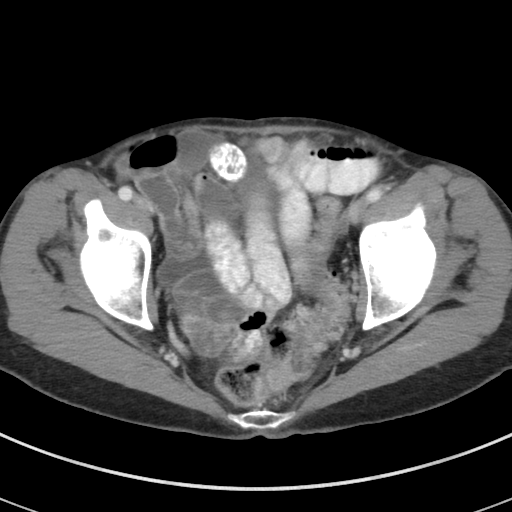
[im 24/85  soft-tissue]
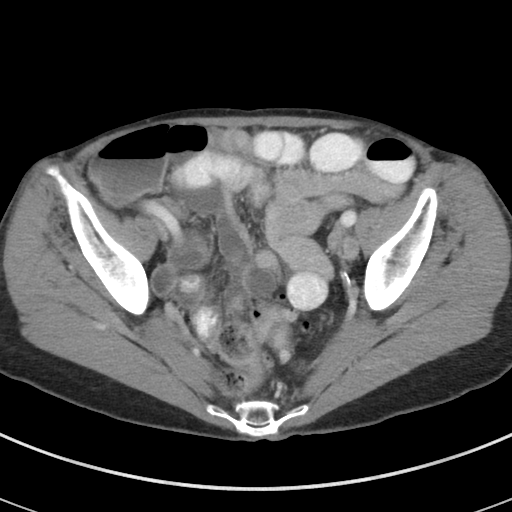
[im 33/85  soft-tissue]
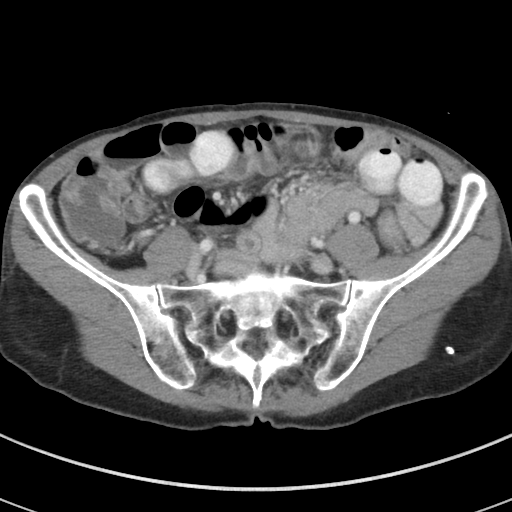
[im 38/85  soft-tissue]
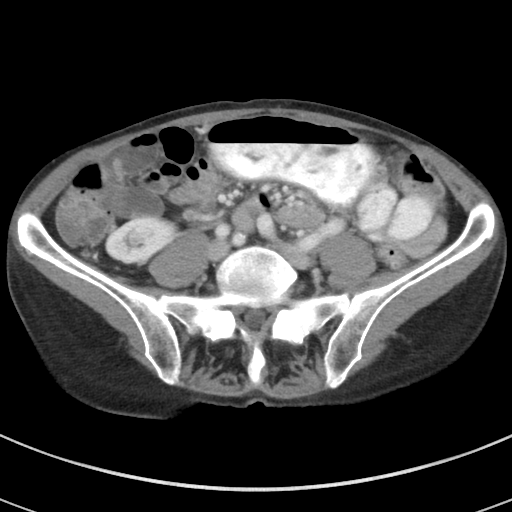
[im 47/85  soft-tissue]
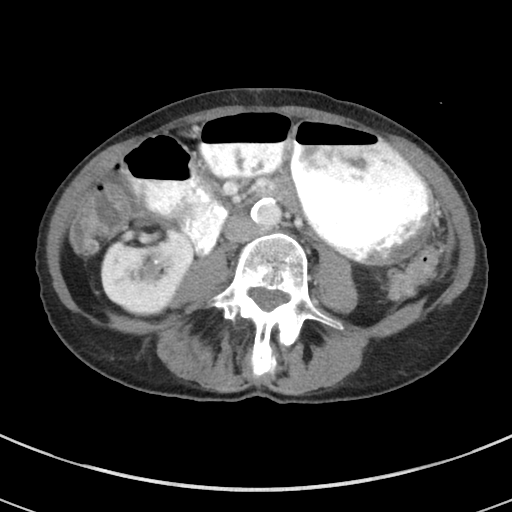
[im 52/85  soft-tissue]
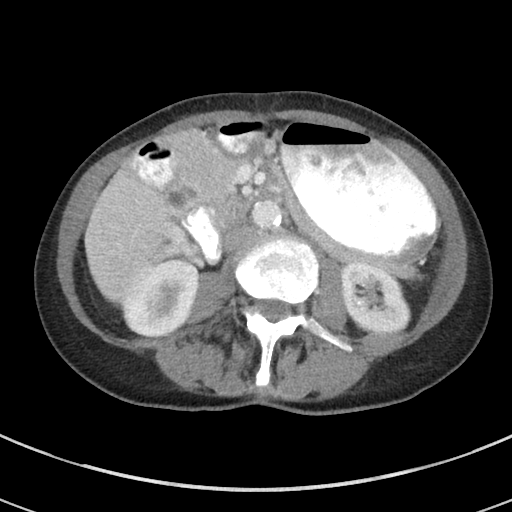
[im 61/85  soft-tissue]
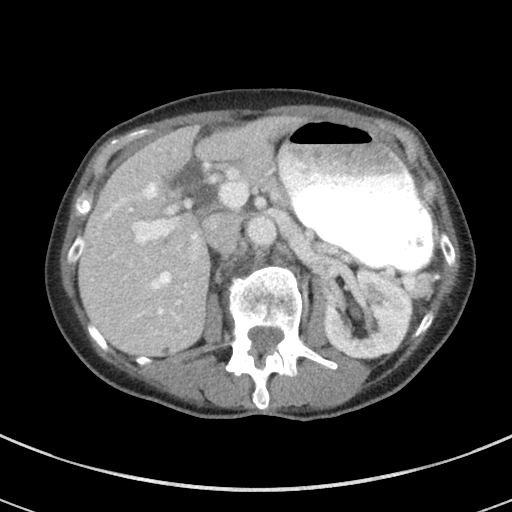
[im 61/85  bone]
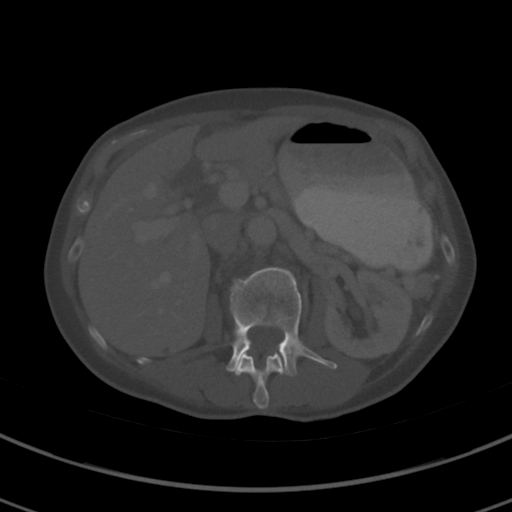
[im 66/85  soft-tissue]
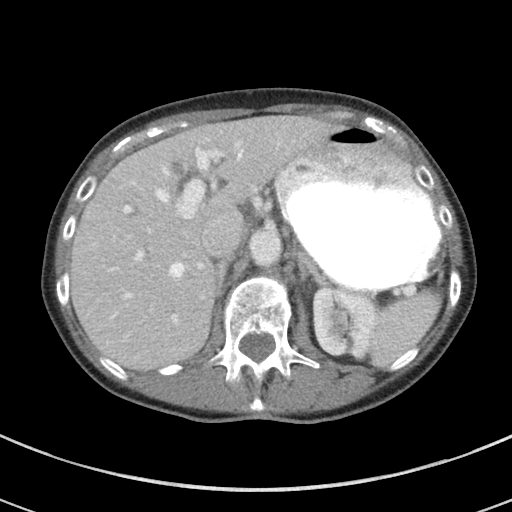
[im 71/85  soft-tissue]
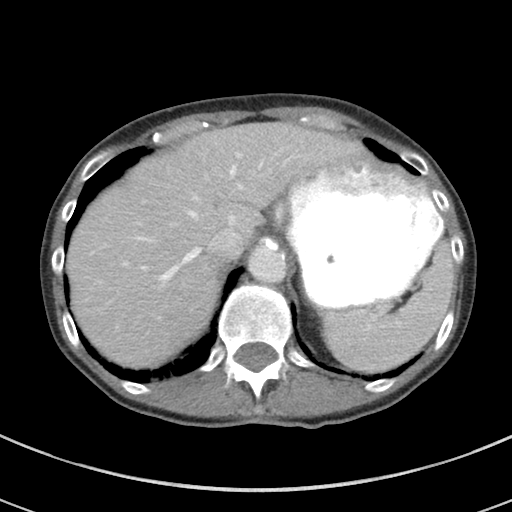
[im 80/85  soft-tissue]
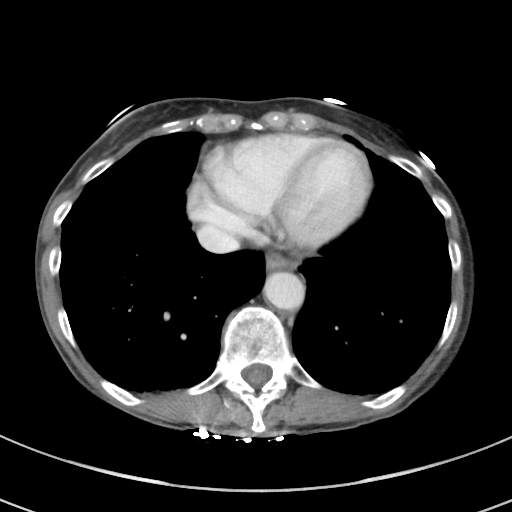

[Series 5: a/p w/ cor · coronal · 0.86mm/px · 3 of 145 slices shown]
[im 49/145  soft-tissue]
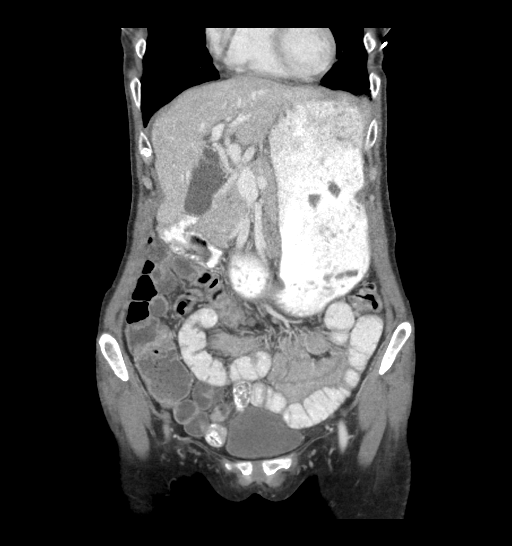
[im 65/145  soft-tissue]
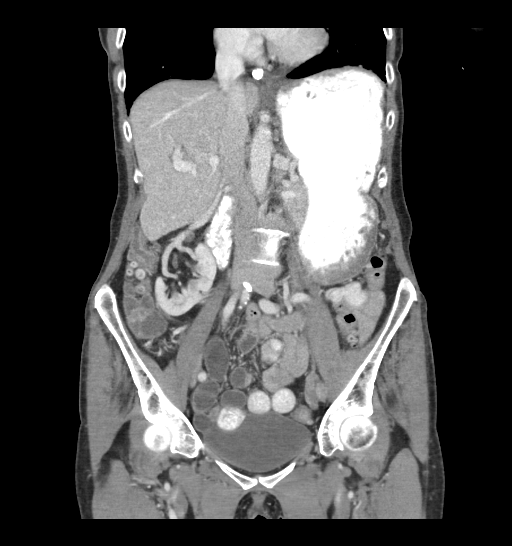
[im 81/145  soft-tissue]
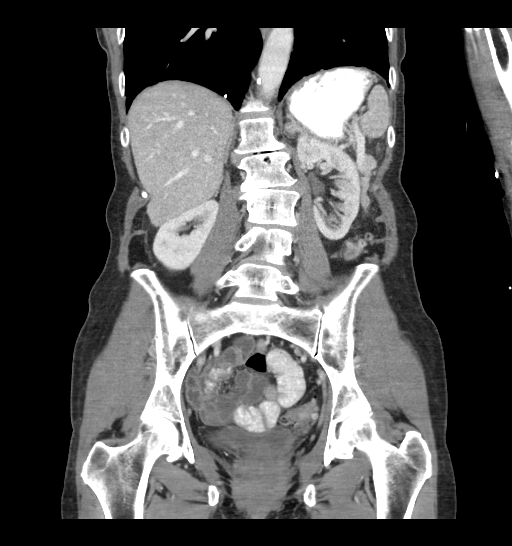

[15 of 46 positions shown; findings below may reference images not displayed]

FINDINGS: Lower chest: The lung bases are clear without focal nodule, mass, or
airspace disease. The heart size is normal. No significant
pericardial or pleural effusion is present.

Hepatobiliary: Scattered punctate calcifications suggest prior
granulomatous disease. A tiny cysts is present in the left lobe of
the liver on image 16. The gallbladder is within normal limits. The
common bile duct is within normal limits for age.

Pancreas: The pancreas is within normal limits.

Spleen: Unremarkable

Adrenals/Urinary Tract: The adrenal glands are normal bilaterally. A
5 mm nonobstructing stone is present at the lower pole of the left
kidney. A 5 mm cyst is present at the lower pole of the left kidney.
The right kidney is slightly under rotated. There is a punctate
nonobstructing stone in the midportion of the collecting system.

The ureters are within normal limits bilaterally. The urinary
bladder is unremarkable.

Stomach/Bowel: The stomach is moderately distended. No mechanical
eyelid obstruction is evident. The duodenum contains high-density
contrast proximal to the superior mesenteric artery more than
distal. There is some oral contrast within the small bowel. A focal
caliber change is present in the mid abdomen, best seen on image 57
of series 5 (coronal). High-density contrast is seen just proximal
to this transition point. There is no mass lesion or focal
obstruction. More distal small bowel is fluid containing. The
appendix and cecum are within normal limits.

Extensive diverticular changes are present within the descending and
sigmoid colon. There is no inflammatory change about the distal
sigmoid colon. There is some mild inflammation at the level of the
splenic flexure which could represent acute diverticulitis. Some
diverticular changes present in the distal transverse colon as well.

Vascular/Lymphatic: Atherosclerotic calcifications are present in
the aorta and branch vessels without aneurysm. No significant
adenopathy is present.

Reproductive: Hysterectomy is noted. The adnexa are within normal
limits for age.

Other: No significant free fluid is present. There is no evidence
for bowel perforation or focal abscess.

Musculoskeletal: Focal levoconvex curvature of the lumbar spine is
centered at L2-3. Asymmetric right-sided endplate changes and
subchondral cystic changes noted. Vertebral body heights are
maintained. There slight retrolisthesis at L2-3, degenerative.
Minimal degenerative anterolisthesis is present at L4-5.
IMPRESSION: 1. Diverticular changes within the distal transverse colon,
descending colon, and sigmoid colon. There is focal inflammatory
change near the splenic flexure which could represent early
diverticulitis. No other significant inflammatory changes are
associated with the diverticular disease.
2. Focal change in bowel caliber in the mid abdomen without evidence
for focal mass lesion or obstruction. This may represent an ileus.
Internal herniation is considered less likely.
3. Focal contrast at the level of the superior mesenteric artery may
reflect some narrowing about the third portion of the duodenum.
4. Degenerative changes in the lumbar spine as described.

## 2016-09-30 ENCOUNTER — Other Ambulatory Visit: Payer: Self-pay | Admitting: Family Medicine

## 2016-09-30 DIAGNOSIS — Z1231 Encounter for screening mammogram for malignant neoplasm of breast: Secondary | ICD-10-CM

## 2016-10-03 ENCOUNTER — Ambulatory Visit
Admission: RE | Admit: 2016-10-03 | Discharge: 2016-10-03 | Disposition: A | Discharge status: Home / Self Care | Payer: Medicare Other | Source: Ambulatory Visit | Attending: Family Medicine | Admitting: Family Medicine

## 2016-10-03 DIAGNOSIS — Z1231 Encounter for screening mammogram for malignant neoplasm of breast: Secondary | ICD-10-CM

## 2017-05-13 ENCOUNTER — Other Ambulatory Visit: Payer: Self-pay | Admitting: Dermatology

## 2017-06-25 ENCOUNTER — Other Ambulatory Visit: Payer: Self-pay | Admitting: Dermatology

## 2018-12-13 ENCOUNTER — Encounter: Payer: Self-pay | Admitting: *Deleted

## 2019-03-10 ENCOUNTER — Other Ambulatory Visit: Payer: Self-pay | Admitting: Family Medicine

## 2019-03-10 DIAGNOSIS — M810 Age-related osteoporosis without current pathological fracture: Secondary | ICD-10-CM

## 2019-07-15 ENCOUNTER — Ambulatory Visit: Payer: Medicare Other | Attending: Internal Medicine

## 2019-07-15 DIAGNOSIS — Z23 Encounter for immunization: Secondary | ICD-10-CM | POA: Insufficient documentation

## 2019-07-15 NOTE — Progress Notes (Signed)
   Covid-19 Vaccination Clinic  Name:  Correen Rebeck    MRN: [id number] DOB: Jun 05, 1936  07/15/2019  Ms. Kosak was observed post Covid-19 immunization for 15 minutes without incidence. She was provided with Vaccine Information Sheet and instruction to access the V-Safe system.   Ms. Mccommon was instructed to call 911 with any severe reactions post vaccine: Marland Kitchen Difficulty breathing  . Swelling of your face and throat  . A fast heartbeat  . A bad rash all over your body  . Dizziness and weakness    Immunizations Administered    Name Date Dose VIS Date Route   Pfizer COVID-19 Vaccine 07/15/2019 10:31 AM 0.3 mL 06/03/2019 Intramuscular   Manufacturer: [place]   Lot: [id number]   [place]: [id number]

## 2019-08-06 ENCOUNTER — Ambulatory Visit: Payer: Medicare Other | Attending: Internal Medicine

## 2019-08-06 DIAGNOSIS — Z23 Encounter for immunization: Secondary | ICD-10-CM

## 2019-08-06 NOTE — Progress Notes (Signed)
   Covid-19 Vaccination Clinic  Name:  Candace Baldwin    MRN: [id number] DOB: 08/07/1935  08/06/2019  [name] was observed post Covid-19 immunization for 15 minutes without incidence. She was provided with Vaccine Information Sheet and instruction to access the V-Safe system.   [name] was instructed to call 911 with any severe reactions post vaccine: Marland Kitchen Difficulty breathing  . Swelling of your face and throat  . A fast heartbeat  . A bad rash all over your body  . Dizziness and weakness    Immunizations Administered    Name Date Dose VIS Date Route   Pfizer COVID-19 Vaccine 08/06/2019 11:35 AM 0.3 mL 06/03/2019 Intramuscular   Manufacturer: [place]   Lot: [id number]   [place]: [id number]

## 2019-08-11 ENCOUNTER — Ambulatory Visit: Payer: Medicare Other | Admitting: Neurology

## 2019-09-13 ENCOUNTER — Telehealth: Payer: Self-pay | Admitting: Neurology

## 2019-09-13 ENCOUNTER — Ambulatory Visit: Payer: Medicare Other | Admitting: Neurology

## 2019-09-13 ENCOUNTER — Encounter: Payer: Self-pay | Admitting: Neurology

## 2019-09-13 ENCOUNTER — Other Ambulatory Visit: Payer: Self-pay

## 2019-09-13 DIAGNOSIS — F0150 Vascular dementia without behavioral disturbance: Secondary | ICD-10-CM | POA: Diagnosis not present

## 2019-09-13 DIAGNOSIS — F0390 Unspecified dementia without behavioral disturbance: Secondary | ICD-10-CM

## 2019-09-13 NOTE — Progress Notes (Signed)
[id number]    Provider:  Dr Jaynee Eagles Requesting Provider: Vernie Shanks, MD Primary Care Provider:  Vernie Shanks, MD  CC:  Cognitive decline  HPI:  Candace Baldwin is a 84 y.o. female here as requested by Vernie Shanks, MD for cognitive decline. PMHx adjustment d/o with anxiety and depression, osteoarthritis.  I reviewed Dr. Jodi Mourning notes, son says she has been diagnosed with cognitive impairment, recently complaining of being "foggy headed ", she thinks it might be her meds her citalopram and maybe even the lisinopril, diagnosed with age-related cognitive decline, she is still able to be at home, she can bathe and feed herself as long she does not have to cook, son is looking into home care retirement communities, she was placed on the Aricept and she did have some side effects nausea and vomiting and the Aricept was then ordered half tab and the citalopram was decreased to 10 mg.  At last appointment patient said that she was digressing backwards rapidly, only occasional social activity, she has a PhD from [place].  Last lab work in September 2020 which was unremarkable CBC, CMP with BUN 27 and creatinine 0.81, B12 was 312, TSH 1.04, vitamin D 46.4.  She has a PhD from [employer]. She says 2 years ago she started forgetting things, forgetting where her purse was for example. She bought a pad and started writing things down and after that she eliminated the problem, she started putting things in a certain place. She said this was frightening when it happened. 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 Now if she is tired it can be worse. She feels she kinda stopped it but she is getting tired of writing things down. She lives with her dog, she has a town home, her flowers are lovely and she has a shade garden. She says she pays her own bills She has forgotten to pay bills. Her son is concerned about her memory.  Her son's girlfriend offered to help and is now paying  the bills.She is not taking anything daily as far as meds go. She states she never misses appointments. She declines her son coming in with her and I suspect history is lacking and there are more deficits than patient is telling me.She denies dementia in the family. Son says its more, confusing, agitation, more memory loss than stated.   Reviewed notes, labs and imaging from outside physicians, which showed: I reviewed images from 2005 CT head and agree with the following:  1. Negative for intracranial process or bleed.  2. Mild atrophy with nonspecific white matter changes in the right frontal lobe.   3. Minimal sphenoid sinus disease.        Review of Systems: Patient complains of symptoms per HPI as well as the following symptoms: Memory loss, confusion, depression, anxiety, feeling cold, weight loss, cramps. Pertinent negatives and positives per HPI. All others negative.   Social History   Socioeconomic History  . Marital status: Divorced    Spouse name: Not on file  . Number of children: 2  . Years of education: Not on file  . Highest education level: Doctorate  Occupational History  . Occupation: Retired    Comment: Advertising account planner from [employer]  . Smoking status: Never Smoker  . Smokeless tobacco: Never Used  Substance and Sexual Activity  . Alcohol use: Yes    Comment: OCC GLASS OF WINE, DRINKS SOCIALLY  . Drug use: Never  .  Sexual activity: Not on file  Other Topics Concern  . Not on file  Social History Narrative   LIves at home alone   Caffeine: occasionally   Social Determinants of Health   Financial Resource Strain:   . Difficulty of Paying Living Expenses:   Food Insecurity:   . Worried About Charity fundraiser in the Last Year:   . Arboriculturist in the Last Year:   Transportation Needs:   . Film/video editor (Medical):   Marland Kitchen Lack of Transportation (Non-Medical):   Physical Activity:   . Days of Exercise per Week:   . Minutes of Exercise per  Session:   Stress:   . Feeling of Stress :   Social Connections:   . Frequency of Communication with Friends and Family:   . Frequency of Social Gatherings with Friends and Family:   . Attends Religious Services:   . Active Member of Clubs or Organizations:   . Attends Archivist Meetings:   Marland Kitchen Marital Status:   Intimate Partner Violence:   . Fear of Current or Ex-Partner:   . Emotionally Abused:   Marland Kitchen Physically Abused:   . Sexually Abused:     Family History  Problem Relation Age of Onset  . Heart failure Father   . Other Father        ?heart attack  . Rheum arthritis Paternal Grandmother        Aunt x 2  . Breast cancer Mother   . Cancer Sister   . Arthritis Sister   . Diabetes Brother   . Dementia Neg Hx     Past Medical History:  Diagnosis Date  . Allergic rhinitis   . ALLERGIC RHINITIS 10/29/2007   Qualifier: Diagnosis of  By: Jenny Reichmann MD, Hunt Oris   . BCC (basal cell carcinoma) 09/01/1978   Right upper chest (Dr Radford Pax)  . BCC (basal cell carcinoma) 07/23/1981   Left Neck (Dr. Radford Pax)  . BCC (basal cell carcinoma) 03/17/1987   neck (Dr. Radford Pax)  . BCC (basal cell carcinoma) 08/14/1989   Left malar area, left clavicle (Dr. Radford Pax)  . BCC (basal cell carcinoma) 07/22/1985   Left neck, Left nose (Dr. Radford Pax)  . BCC (basal cell carcinoma) 04/27/1990   left upperarm  . BCC (basal cell carcinoma) 05/11/1991   right chest (Dr. Radford Pax)  . BCC (basal cell carcinoma) 02/02/2008   above right upper lip (MOHS), Behind Left ear (CX35FU)  . BCC (basal cell carcinoma) 05/13/2017   Right neck  . Chronic hoarseness 06/04/2011   Onset winter of 2011-2012   . DEGENERATIVE JOINT DISEASE, HANDS 01/23/2007   Qualifier: Diagnosis of  By: Jenny Reichmann MD, Hunt Oris   . Diverticulosis   . DIVERTICULOSIS, COLON 01/23/2007   Qualifier: Diagnosis of  By: Jenny Reichmann MD, Hunt Oris   . DJD (degenerative joint disease)   . Heart murmur   . Hyperlipidemia   . HYPERLIPIDEMIA 01/23/2007   Qualifier:  Diagnosis of  By: Jenny Reichmann MD, Hunt Oris   . Osteoporosis   . OSTEOPOROSIS 10/29/2007   Qualifier: Diagnosis of  By: Jenny Reichmann MD, Hunt Oris   . Shingles   . SKIN CANCER, HX OF 01/23/2007   Qualifier: Diagnosis of  By: Jenny Reichmann MD, Hunt Oris     Patient Active Problem List   Diagnosis Date Noted  . Diverticulitis 05/29/2015  . Dehydration 05/29/2015  . Nausea and vomiting 05/29/2015  . Superior mesenteric artery stenosis ([place]) 05/29/2015  . Hyperglycemia 05/29/2015  .  Acute exacerbation of COPD with asthma ([place]) 08/06/2012  . Preventative health care 09/06/2011  . Chronic hoarseness 06/04/2011  . SHINGLES 04/12/2008  . ALLERGIC RHINITIS 10/29/2007  . OSTEOPOROSIS 10/29/2007  . HYPERLIPIDEMIA 01/23/2007  . DIVERTICULOSIS, COLON 01/23/2007  . DEGENERATIVE JOINT DISEASE, HANDS 01/23/2007  . SKIN CANCER, HX OF 01/23/2007  . CARDIAC MURMUR, HX OF 01/23/2007    Past Surgical History:  Procedure Laterality Date  . CATARACT EXTRACTION, BILATERAL    . FOOT SURGERY  2011   Bunion with prolonged recovery  . MOHS SURGERY     Nose  . VAGINAL HYSTERECTOMY  1990    Current Outpatient Medications  Medication Sig Dispense Refill  . Aclidinium Bromide (TUDORZA PRESSAIR) 400 MCG/ACT AEPB Inhale 1 puff into the lungs 2 (two) times daily. (Patient not taking: Reported on 05/29/2015) 1 each 0  . albuterol (PROAIR HFA) 108 (90 BASE) MCG/ACT inhaler Inhale 2 puffs into the lungs 4 (four) times daily as needed. (Patient not taking: Reported on 05/29/2015) 1 Inhaler 11   No current facility-administered medications for this visit.    Allergies as of 09/13/2019 - Review Complete 09/13/2019  Allergen Reaction Noted  . Lipitor [atorvastatin]  09/09/2010  . Montelukast sodium    . Penicillins  01/23/2007  . Simvastatin  09/09/2010  . Sulfonamide derivatives  01/23/2007    Vitals: BP (!) 162/89 (BP Location: Right Arm, Patient Position: Sitting)   Pulse 89   Temp 97.6 F (36.4 C) Comment: taken at front  Ht 5'  5" (1.651 m)   Wt 103 lb (46.7 kg)   BMI 17.14 kg/m  Last Weight:  Wt Readings from Last 1 Encounters:  09/13/19 103 lb (46.7 kg)   Last Height:   Ht Readings from Last 1 Encounters:  09/13/19 5\' 5"  (1.651 m)     Physical exam: Exam: Gen: NAD, conversant,Tangential, thin, well groomed                     CV: RRR, no MRG. No Carotid Bruits. No peripheral edema, warm, nontender Eyes: Conjunctivae clear without exudates or hemorrhage  Neuro: Detailed Neurologic Exam  Speech:    Speech is normal; fluent and spontaneous with normal comprehension.  Cognition:     MMSE - Mini Mental State Exam 09/13/2019  Orientation to time 4  Orientation to Place 2  Registration 3  Attention/ Calculation 1  Recall 0  Language- name 2 objects 2  Language- repeat 1  Language- follow 3 step command 3  Language- read & follow direction 1  Write a sentence 1  Copy design 0  Total score 18    Cranial Nerves:    The pupils are equal, round, and reactive to light. Attempted fundoscopy could not visualize due to amall puils.  Visual fields are full to finger confrontation. Extraocular movements are intact. Trigeminal sensation is intact and the muscles of mastication are normal. The face is symmetric. The palate elevates in the midline. Hearing intact. Voice is normal. Shoulder shrug is normal. The tongue has normal motion without fasciculations.   Coordination:    Normal finger to nose  Gait:    Normal native gait  Motor Observation:    No asymmetry, no atrophy, and no involuntary movements noted. Tone:    Normal muscle tone.    Posture:    Posture is normal. normal erect    Strength:    Strength is V/V in the upper and lower limbs.      Sensation:  intact to LT     Reflex Exam:  DTR's: Absent AJs otherwise deep tendon reflexes in the upper and lower extremities are normal bilaterally.   Toes:    The toes are downgoing bilaterally.   Clonus:    Clonus is absent.     Assessment/Plan: Really lovely 84 year old with no significant past medical history, highly educated she has a PhD from [employer], with cognitive decline likely major neurocognitive disorder.  She did not want me to bring her son back to the room, and I suspect history is lacking and there are more deficits than patient is telling me.  Her MMSE was 18 out of 30.  I did touch base with son who states that there is confusion, agitation, he is starting to think about ways to help support his mom, her living situation, and of course he is very interested in her diagnosis and advanced state.  Based on the little information I received from the patient this may be Alzheimer's, I do recommend we get MRI of the brain to look for vascular or other causes of dementia, get some blood work today, and sent for formal memory testing and follow back up after that.  Orders Placed This Encounter  Procedures  . MR BRAIN W WO CONTRAST  . B12 and Folate Panel  . Methylmalonic acid, serum  . Homocysteine  . TSH  . CBC  . Comprehensive metabolic panel  . Ambulatory referral to Neuropsychology    Cc: Vernie Shanks, MD  Sarina Ill, MD  [facility] [address] [place], [place] [address]  Phone [phone] Fax [phone]

## 2019-09-13 NOTE — Patient Instructions (Addendum)
MRI of the brain Blood work formal memory testing - Dr. Sima Matas  Memory Compensation Strategies  1. Use "WARM" strategy.  W= write it down  A= associate it  R= repeat it  M= make a mental note  2.   You can keep a Social worker.  Use a 3-ring notebook with sections for the following: calendar, important names and phone numbers,  medications, doctors' names/phone numbers, lists/reminders, and a section to journal what you did  each day.   3.    Use a calendar to write appointments down.  4.    Write yourself a schedule for the day.  This can be placed on the calendar or in a separate section of the Memory Notebook.  Keeping a  regular schedule can help memory.  5.    Use medication organizer with sections for each day or morning/evening pills.  You may need help loading it  6.    Keep a basket, or pegboard by the door.  Place items that you need to take out with you in the basket or on the pegboard.  You may also want to  include a message board for reminders.  7.    Use sticky notes.  Place sticky notes with reminders in a place where the task is performed.  For example: " turn off the  stove" placed by the stove, "lock the door" placed on the door at eye level, " take your medications" on  the bathroom mirror or by the place where you normally take your medications.  8.    Use alarms/timers.  Use while cooking to remind yourself to check on food or as a reminder to take your medicine, or as a  reminder to make a call, or as a reminder to perform another task, etc.

## 2019-09-13 NOTE — Telephone Encounter (Signed)
[employer] medicare order sent to [facility]. No auth they will reach out to the patient to schedule.  

## 2019-09-15 ENCOUNTER — Encounter: Payer: Self-pay | Admitting: Psychology

## 2019-09-15 ENCOUNTER — Telehealth: Payer: Self-pay | Admitting: Neurology

## 2019-09-15 ENCOUNTER — Encounter: Payer: Self-pay | Admitting: *Deleted

## 2019-09-16 NOTE — Progress Notes (Signed)
I apologize, a call is not needed to the patient. We have contacted them and sent a letter as well.

## 2019-09-21 LAB — TSH: TSH: 1.47 u[IU]/mL (ref 0.450–4.500)

## 2019-09-21 LAB — COMPREHENSIVE METABOLIC PANEL
ALT: 16 IU/L (ref 0–32)
AST: 48 IU/L — ABNORMAL HIGH (ref 0–40)
Albumin/Globulin Ratio: 1.9 (ref 1.2–2.2)
Albumin: 4.3 g/dL (ref 3.6–4.6)
Alkaline Phosphatase: 98 IU/L (ref 39–117)
BUN/Creatinine Ratio: 22 (ref 12–28)
BUN: 18 mg/dL (ref 8–27)
Bilirubin Total: <0.2 mg/dL (ref 0.0–1.2)
CO2: 24 mmol/L (ref 20–29)
Calcium: 9.5 mg/dL (ref 8.7–10.3)
Chloride: 105 mmol/L (ref 96–106)
Creatinine, Ser: 0.81 mg/dL (ref 0.57–1.00)
GFR calc Af Amer: 78 mL/min/{1.73_m2} (ref >59)
GFR calc non Af Amer: 68 mL/min/{1.73_m2} (ref >59)
Globulin, Total: 2.3 g/dL (ref 1.5–4.5)
Glucose: 105 mg/dL — ABNORMAL HIGH (ref 65–99)
Potassium: 4.1 mmol/L (ref 3.5–5.2)
Sodium: 142 mmol/L (ref 134–144)
Total Protein: 6.6 g/dL (ref 6.0–8.5)

## 2019-09-21 LAB — CBC
Hematocrit: 41.2 % (ref 34.0–46.6)
Hemoglobin: 13.4 g/dL (ref 11.1–15.9)
MCH: 31.1 pg (ref 26.6–33.0)
MCHC: 32.5 g/dL (ref 31.5–35.7)
MCV: 96 fL (ref 79–97)
Platelets: 240 10*3/uL (ref 150–450)
RBC: 4.31 x10E6/uL (ref 3.77–5.28)
RDW: 13.4 % (ref 11.7–15.4)
WBC: 7.3 10*3/uL (ref 3.4–10.8)

## 2019-09-21 LAB — B12 AND FOLATE PANEL
Folate: 12.6 ng/mL (ref >3.0)
Vitamin B-12: 675 pg/mL (ref 232–1245)

## 2019-09-21 LAB — METHYLMALONIC ACID, SERUM: Methylmalonic Acid: 191 nmol/L (ref 0–378)

## 2019-09-21 LAB — HOMOCYSTEINE: Homocysteine: 13.5 umol/L (ref 0.0–21.3)

## 2019-10-10 ENCOUNTER — Other Ambulatory Visit: Payer: Self-pay

## 2019-10-10 ENCOUNTER — Ambulatory Visit
Admission: RE | Admit: 2019-10-10 | Discharge: 2019-10-10 | Disposition: A | Discharge status: Home / Self Care | Payer: Medicare Other | Source: Ambulatory Visit | Attending: Neurology | Admitting: Neurology

## 2019-10-10 DIAGNOSIS — F0390 Unspecified dementia without behavioral disturbance: Secondary | ICD-10-CM

## 2019-10-10 DIAGNOSIS — F0150 Vascular dementia without behavioral disturbance: Secondary | ICD-10-CM

## 2019-10-10 MED ORDER — GADOBENATE DIMEGLUMINE 529 MG/ML IV SOLN
9.0000 mL | Freq: Once | INTRAVENOUS | Status: AC | PRN
  Administered 2019-10-10: 9 mL via INTRAVENOUS

## 2019-11-10 ENCOUNTER — Other Ambulatory Visit: Payer: Self-pay

## 2019-11-10 ENCOUNTER — Encounter: Payer: Medicare Other | Attending: Psychology | Admitting: Psychology

## 2019-11-10 DIAGNOSIS — F0150 Vascular dementia without behavioral disturbance: Secondary | ICD-10-CM

## 2019-11-10 DIAGNOSIS — F0390 Unspecified dementia without behavioral disturbance: Secondary | ICD-10-CM

## 2019-11-24 ENCOUNTER — Encounter: Payer: Medicare Other | Attending: Psychology | Admitting: Psychology

## 2019-11-24 ENCOUNTER — Encounter: Payer: Self-pay | Admitting: Psychology

## 2019-11-24 ENCOUNTER — Other Ambulatory Visit: Payer: Self-pay

## 2019-11-24 DIAGNOSIS — F0150 Vascular dementia without behavioral disturbance: Secondary | ICD-10-CM | POA: Insufficient documentation

## 2019-11-24 DIAGNOSIS — F0390 Unspecified dementia without behavioral disturbance: Secondary | ICD-10-CM

## 2019-11-30 ENCOUNTER — Encounter (HOSPITAL_BASED_OUTPATIENT_CLINIC_OR_DEPARTMENT_OTHER): Payer: Medicare Other | Admitting: Psychology

## 2019-11-30 ENCOUNTER — Other Ambulatory Visit: Payer: Self-pay

## 2019-11-30 DIAGNOSIS — F0390 Unspecified dementia without behavioral disturbance: Secondary | ICD-10-CM

## 2019-11-30 DIAGNOSIS — F0150 Vascular dementia without behavioral disturbance: Secondary | ICD-10-CM | POA: Diagnosis not present

## 2019-12-05 ENCOUNTER — Encounter: Payer: Self-pay | Admitting: Psychology

## 2019-12-05 NOTE — Progress Notes (Signed)
Neuropsychological Consultation   Patient:   Candace Baldwin   DOB:   1935/09/29  MR Number:  [id number]  Location:  [place] [facility] PHYSICAL MEDICINE AND REHABILITATION [place], [place] [id number] [address] [place] [address] Dept: [phone]           Date of Service:   11/10/19  Start Time:   3 PM End Time:   5 PM  Today's visit was an in person visit that was conducted in my outpatient clinic office with the patient, her son and myself present.  1 hour and 15 minutes were spent in a face-to-face clinical interview and the other 45 minutes was spent with records review and report writing.  Provider/Observer:  Ilean Skill, Psy.D.       Clinical Neuropsychologist       Billing Code/Service: 96116/96121  Chief Complaint:    Candace Baldwin "Candace Baldwin" is an 84 year old female referred by Sarina Ill, MD for neuropsychological evaluation as part of a complete neurological work-up after referral by her primary care physician Yaakov Guthrie, MD due to cognitive decline.  The patient has a past medical history including a history of adjustment disorder with anxiety and depression as well as osteoarthritis.  The patient has past medical history including osteoporosis, hyperlipidemia, degenerative disc disease, multiple treatments for basal cell carcinoma, hyperlipidemia, and diverticulitis.  The patient has been diagnosed by her PCP with cognitive impairments and he speaks in his notes of the patient complaining of being "foggy headed" and was diagnosed with age-related cognitive decline.  The patient has continued to live independently and has been able to take care of herself as long as she does not have to cook.  The patient was tried on Aricept but had some side effects including nausea and vomiting in both her citalopram and Aricept were cut in half.  Reason for Service:  REANNAH TOTTEN "Candace Baldwin" is an 84 year old female  referred by Sarina Ill, MD for neuropsychological evaluation as part of a complete neurological work-up after referral by her primary care physician Yaakov Guthrie, MD due to cognitive decline.  The patient has a past medical history including a history of adjustment disorder with anxiety and depression as well as osteoarthritis.  The patient has past medical history including osteoporosis, hyperlipidemia, degenerative disc disease, multiple treatments for basal cell carcinoma, hyperlipidemia, and diverticulitis.  The patient has been diagnosed by her PCP with cognitive impairments and he speaks in his notes of the patient complaining of being "foggy headed" and was diagnosed with age-related cognitive decline.  The patient has continued to live independently and has been able to take care of herself as long as she does not have to cook.  The patient was tried on Aricept but had some side effects including nausea and vomiting in both her citalopram and Aricept were cut in half.  During the clinical interview today the patient reports that she has been having increasing difficulties with memory but feels that it is been better most recently.  The patient reports that she use note taking to keep up with losing stuff such as her pocketbook and keeps her notepad insight which she felt helped with her memory.  The patient's son was also present for this clinical interview and patient son reports that the patient has always been a very active person and she takes great pride in her shade garden and still drives but has reduced her range and she only goes  to familiar places.  The patient's son reports that her primary issues have to do with memory difficulties and that she has been very embarrassed by being forgetful and most of her anxiety and emotional distress around family issues.  He acknowledges some change in geographic orientation and she used to be very good at things like this and had worked as a Dealer for a [employer].  The patient has her bills set on auto pay and is helped by paying her bills by her son.  The patient and her son reports that her cognitive changes have been a steady progressive decline for as much as the past 5 years.  There is some increasing difficulties with reasoning and problem-solving as well.  Over the past 2 years the patient has acknowledged forgetting more things especially simple objects such as her purse.  She acknowledges being very frightened by this happening.  The patient reports that she has a regular sleep pattern and goes to bed very early around 7 PM and sleeps until 8 AM.  She describes her appetite as "not great", and mostly eats sweets but does have fruit and yogurt for breakfast.  The patient had a brain MRI conducted on 10/10/2019 with the impressions as follows: Moderate generalized cortical atrophy most pronounced in the medial temporal lobes, extensive T2/flair hyperintense foci in the hemispheres in the pons consistent with moderate chronic microvascular ischemic changes.  There were also no acute findings.  Behavioral Observation: Candace Baldwin  presents as a 84 y.o.-year-old Right Caucasian Female who appeared her stated age. her dress was Appropriate and she was Well Groomed and her manners were Appropriate to the situation.  her participation was indicative of Appropriate and Redirectable behaviors.  There were not any physical disabilities noted.  she displayed an appropriate level of cooperation and motivation.     Interactions:    Active Appropriate and Redirectable  Attention:   abnormal and attention span appeared shorter than expected for age  Memory:   abnormal; remote memory intact, recent memory impaired  Visuo-spatial:  not examined  Speech (Volume):  normal  Speech:   normal; normal  Thought Process:  Coherent and Relevant  Though Content:  WNL; not suicidal and not homicidal  Orientation:   person, place and  situation  Judgment:   Good  Planning:   Fair  Affect:    Anxious  Mood:    Anxious  Insight:   Good  Intelligence:   high  Marital Status/Living: The patient was born and raised in [place] along with 2 siblings.  No major issues in childhood were noted beyond a hospitalization for pneumonia.  They did find a small "hole" in her heart when she was a child with a slight murmur.  The patient currently lives by herself along with her dog.  The patient was married for 15 years and divorced in 1976.  She has an adult son and an adult daughter.  Current Employment: The patient is retired.  Past Employment:  The patient was Clinical biochemist of training and development at [employer] and also previously worked as a Teaching laboratory technician.  Substance Use:  No concerns of substance abuse are reported.    Education:   The patient received her doctorate degree (doctorate of education from [employer] and maintained a 3.7 GPA)  Medical History:   Past Medical History:  Diagnosis Date  . Allergic rhinitis   . ALLERGIC RHINITIS 10/29/2007   Qualifier: Diagnosis of  By:  Jenny Reichmann MD, Hunt Oris   . BCC (basal cell carcinoma) 09/01/1978   Right upper chest (Dr Radford Pax)  . BCC (basal cell carcinoma) 07/23/1981   Left Neck (Dr. Radford Pax)  . BCC (basal cell carcinoma) 03/17/1987   neck (Dr. Radford Pax)  . BCC (basal cell carcinoma) 08/14/1989   Left malar area, left clavicle (Dr. Radford Pax)  . BCC (basal cell carcinoma) 07/22/1985   Left neck, Left nose (Dr. Radford Pax)  . BCC (basal cell carcinoma) 04/27/1990   left upperarm  . BCC (basal cell carcinoma) 05/11/1991   right chest (Dr. Radford Pax)  . BCC (basal cell carcinoma) 02/02/2008   above right upper lip (MOHS), Behind Left ear (CX35FU)  . BCC (basal cell carcinoma) 05/13/2017   Right neck  . Chronic hoarseness 06/04/2011   Onset winter of 2011-2012   . DEGENERATIVE JOINT DISEASE, HANDS 01/23/2007   Qualifier: Diagnosis of  By: Jenny Reichmann MD, Hunt Oris   .  Diverticulosis   . DIVERTICULOSIS, COLON 01/23/2007   Qualifier: Diagnosis of  By: Jenny Reichmann MD, Hunt Oris   . DJD (degenerative joint disease)   . Heart murmur   . Hyperlipidemia   . HYPERLIPIDEMIA 01/23/2007   Qualifier: Diagnosis of  By: Jenny Reichmann MD, Hunt Oris   . Osteoporosis   . OSTEOPOROSIS 10/29/2007   Qualifier: Diagnosis of  By: Jenny Reichmann MD, Hunt Oris   . Shingles   . SKIN CANCER, HX OF 01/23/2007   Qualifier: Diagnosis of  By: Jenny Reichmann MD, Hunt Oris            Psychiatric History:  No prior psychiatric history beyond some issues with anxiety that are primarily related to concerns about her progressing memory changes.  Family Med/Psych History:  Family History  Problem Relation Age of Onset  . Heart failure Father   . Other Father        ?heart attack  . Rheum arthritis Paternal Grandmother        Aunt x 2  . Breast cancer Mother   . Cancer Sister   . Arthritis Sister   . Diabetes Brother   . Dementia Neg Hx     Risk of Suicide/Violence: virtually non-existent the patient denies any suicidal or homicidal ideation.  Impression/DX:  Candace Baldwin "Candace Baldwin" is an 84 year old female referred by Sarina Ill, MD for neuropsychological evaluation as part of a complete neurological work-up after referral by her primary care physician Yaakov Guthrie, MD due to cognitive decline.  The patient has a past medical history including a history of adjustment disorder with anxiety and depression as well as osteoarthritis.  The patient has past medical history including osteoporosis, hyperlipidemia, degenerative disc disease, multiple treatments for basal cell carcinoma, hyperlipidemia, and diverticulitis.  The patient has been diagnosed by her PCP with cognitive impairments and he speaks in his notes of the patient complaining of being "foggy headed" and was diagnosed with age-related cognitive decline.  The patient has continued to live independently and has been able to take care of herself as long as she does not have  to cook.  The patient was tried on Aricept but had some side effects including nausea and vomiting in both her citalopram and Aricept were cut in half.  Disposition/Plan:  We have set the patient up for formal neuropsychological testing.  We will initially determine whether she will be able to complete the Wechsler scales and Wechsler Memory Scale's and if not we will utilize the RBANS neuropsychological test battery so we will have availability for repeat  testing if needed.  Diagnosis:    Major neurocognitive disorder [facility])         Electronically Signed   _______________________ Ilean Skill, Psy.D.

## 2019-12-05 NOTE — Progress Notes (Signed)
NEUROPSYCHOLOGICAL EVALUATION   Name:    Candace Baldwin  Date of Birth:   1936-04-04 Date of Interview:  11/10/2019 Date of Testing:  11/24/19  Date of Feedback:  12/06/19       Background Information:  Reason for Referral:  Candace Baldwin is a 84 y.o. female referred by Sarina Ill, MD for neuropsychological evaluation as part of a complete neurological work-up after referral by her primary care physician Yaakov Guthrie, MD due to cognitive decline.  The current evaluation consisted of a review of available medical records, an interview with the patient and son, and the completion of a neuropsychological testing battery. Informed consent was obtained.  History of Presenting Problem:  [name] "Candace Baldwin" is an 84 year old female referred by Sarina Ill, MD for neuropsychological evaluation as part of a complete neurological work-up after referral by her primary care physician Yaakov Guthrie, MD due to cognitive decline.  The patient has a past medical history including a history of adjustment disorder with anxiety and depression as well as osteoarthritis.  The patient has past medical history including osteoporosis, hyperlipidemia, degenerative disc disease, multiple treatments for basal cell carcinoma, hyperlipidemia, and diverticulitis.  The patient has been diagnosed by her PCP with cognitive impairments and he speaks in his notes of the patient complaining of being "foggy headed" and was diagnosed with age-related cognitive decline.  The patient has continued to live independently and has been able to take care of herself as long as she does not have to cook.  The patient was tried on Aricept but had some side effects including nausea and vomiting in both her citalopram and Aricept were cut in half.  During the clinical interview today the patient reports that she has been having increasing difficulties with memory but feels that it is been better most recently.  The  patient reports that she use note taking to keep up with losing stuff such as her pocketbook and keeps her notepad insight which she felt helped with her memory.  The patient's son was also present for this clinical interview and patient son reports that the patient has always been a very active person and she takes great pride in her shade garden and still drives but has reduced her range and she only goes to familiar places.  The patient's son reports that her primary issues have to do with memory difficulties and that she has been very embarrassed by being forgetful and most of her anxiety and emotional distress around family issues.  He acknowledges some change in geographic orientation and she used to be very good at things like this and had worked as a Designer, multimedia for a [employer].  The patient has her bills set on auto pay and is helped by paying her bills by her son.  The patient and her son reports that her cognitive changes have been a steady progressive decline for as much as the past 5 years.  There is some increasing difficulties with reasoning and problem-solving as well.  Over the past 2 years the patient has acknowledged forgetting more things especially simple objects such as her purse.  She acknowledges being very frightened by this happening.  The patient reports that she has a regular sleep pattern and goes to bed very early around 7 PM and sleeps until 8 AM.  She describes her appetite as "not great", and mostly eats sweets but does have fruit and yogurt for breakfast.  The patient had a  brain MRI conducted on 10/10/2019 with the impressions as follows: Moderate generalized cortical atrophy most pronounced in the medial temporal lobes, extensive T2/flair hyperintense foci in the hemispheres in the pons consistent with moderate chronic microvascular ischemic changes.  There were also no acute findings.  Medical History:  Past Medical History:  Diagnosis Date   Allergic  rhinitis    ALLERGIC RHINITIS 10/29/2007   Qualifier: Diagnosis of  By: Jenny Reichmann MD, Hunt Oris    [facility] (basal cell carcinoma) 09/01/1978   Right upper chest (Dr Radford Pax)   [facility] (basal cell carcinoma) 07/23/1981   Left Neck (Dr. Radford Pax)   [place] (basal cell carcinoma) 03/17/1987   neck (Dr. Radford Pax)   [place] (basal cell carcinoma) 08/14/1989   Left malar area, left clavicle (Dr. Radford Pax)   [place] (basal cell carcinoma) 07/22/1985   Left neck, Left nose (Dr. Radford Pax)   [place] (basal cell carcinoma) 04/27/1990   left upperarm   [facility] (basal cell carcinoma) 05/11/1991   right chest (Dr. Radford Pax)   [place] (basal cell carcinoma) 02/02/2008   above right upper lip (MOHS), Behind Left ear (CX35FU)   [facility] (basal cell carcinoma) 05/13/2017   Right neck   Chronic hoarseness 06/04/2011   Onset winter of 2011-2012    DEGENERATIVE JOINT DISEASE, HANDS 01/23/2007   Qualifier: Diagnosis of  By: Jenny Reichmann MD, Hunt Oris    Diverticulosis    DIVERTICULOSIS, COLON 01/23/2007   Qualifier: Diagnosis of  By: Jenny Reichmann MD, Hunt Oris    DJD (degenerative joint disease)    Heart murmur    Hyperlipidemia    HYPERLIPIDEMIA 01/23/2007   Qualifier: Diagnosis of  By: Jenny Reichmann MD, Hunt Oris    Osteoporosis    OSTEOPOROSIS 10/29/2007   Qualifier: Diagnosis of  By: Jenny Reichmann MD, Hunt Oris    Shingles    SKIN CANCER, HX OF 01/23/2007   Qualifier: Diagnosis of  By: Jenny Reichmann MD, Hunt Oris    Current medications:  Outpatient Encounter Medications as of 11/30/2019  Medication Sig   Aclidinium Bromide (TUDORZA PRESSAIR) 400 MCG/ACT AEPB Inhale 1 puff into the lungs 2 (two) times daily. (Patient not taking: Reported on 05/29/2015)   albuterol (PROAIR HFA) 108 (90 BASE) MCG/ACT inhaler Inhale 2 puffs into the lungs 4 (four) times daily as needed. (Patient not taking: Reported on 05/29/2015)   No facility-administered encounter medications on file as of 11/30/2019.   Current Examination:  Behavioral Observations:  Appearance: Casually and appropriately dressed with  adequate hygiene. Gait: Ambulated independently without assistance. Slow pace w/decreased balance. Wide based gait Speech: Mostly clear, normal rate, tone & volume. Moderate WFD.  Thought process:  Tangential, somewhat disorganized, but mostly logical. Mild-Moderate inattention.  Mood/Affect: Anxious and tense, appropriate range.  Interpersonal: Polite and appropriate. Orientation: Oriented x 4  Effort/Motivation: Good   The patient did not appear to have difficulty seeing or hearing but did had some trouble understanding instructions at times. She exhibited adequate distress tolerance on questions she did not know or tasks that were more difficult. She was cooperative with all assigned tasks.   Orientation: Oriented to all spheres. Not able to name the current President.    Tests Administered:  Animal Naming   [employer] Drawing Test  Controlled Oral Word Association Test (COWAT)  Repeatable Battery for the Assessment of Neuropsychological Status, Update, Form A (RBANS-A)  Trail Making Test (Part A & B)  Test of Premorbid Functioning (TOPF)  Wechsler Adult Intellectual Scale, 4th Edition (WAIS-IV); select subtests o Symbol Search  o Coding   Test Results:  Note: Standardized scores are presented only for use by appropriately trained professionals and to allow for any future test-retest comparison. These scores should not be interpreted without consideration of all the information that is contained in the rest of the report. The most recent standardization samples from the test publisher or other sources were used whenever possible to derive standard scores; scores were corrected for age, gender, ethnicity and education when available.   Validity: The validity of neuropsychological testing is limited by the extent to which the individual being tested may be assumed to have exerted adequate effort during testing. Behaviorally, the patient appeared motivated and she  persisted well. She commented a number of times when she was particularly struggling during testing. Validity indicators embedded with clinical tests (Reliable Digit Span and recognition memory tests) were within normal limits. Overall, the current results are likely a valid estimate of her capabilities.  Premorbid Intellectual Ability: Given her reported educational and occupational history, patient's premorbid intellectual ability was estimated to be average to high average. Premorbid verbal intellectual abilities were estimated to have been within the average range based on a test of word reading (53rd percentile).   Test of Premorbid Functioning   Total =41/70, SS=101, 53rd %  Overall Cognitive Status: The patient was given a neuropsychological screening battery (RBANS), which contains 12 subtests covering five neuropsychological domains. Her total scale score on that battery, a composite of performance across tests and estimate of global cognitive functioning, was 65, which was severely impaired for her age (1st percentile). Performance on individual tests and domain scores are provided below.  Results of the RBANS-Update (Form A):  Measure Standard or Scaled Score Percentile Description  Immediate Memory 53 0.1 Severely Impaired  List Learning 3 1 Moderately Impaired  Story Memory 2 <1 Severely Impaired  Visuospatial/Constructional 81 10 Low Average  Figure Copy 5 5 Borderline  Line Orientation - 26-50 Average  Language 71 3 Borderline  Picture Naming - 3-9 Borderline-Low Average  Semantic Fluency 5 5 Borderline  Attention 91 27 Average  Digit Span 15 95 Superior  Coding 2 <1 Severely Impaired  Delayed Memory 64 1 Moderately Impaired  List Recall - 10-16 Low Average  List Recognition - 10-16 Low Average  Story Recall 2 <1 Severely Impaired  Figure Recall 6 9 Low Average  Total Score 65 1 Moderately Impaired   Attention/Processing Speed:  The patients RBANS Attention Index score  of 91 was average for her age (27th percentile). Her performance on a test of auditory attention (RBANS Digit Span) was superior (95th percentile) and she repeated up to 8 digits in order. A test of processing speed with visual scanning (RBANS Coding) was severely impaired (<1st percentile).    The patient was administered the two primary subtests from the  Wechsler Adult Intellectual Scale (4th edition) to obtain a measure of her current processing speed The patient produced a processing speed index score of 79 which falls at the 8th percentile and is in the upper end of the borderline Impaired range.  This is below predicted levels and does suggest a clinically meaningful reduction in overall information processing speed, visual scanning and visual searching abilities. Individual subtests making up this domain are variable and suggest difficulties with motor functioning and vertically visual scanning compared to visual scanning primarily horizontally   WAIS-IV PSI: SS=79, 8th %  Symbol Search: 25th percentile  Coding: <1st percentile   Another test of processing speed with visual scanning  and sequencing (Trails A) was severely  impaired for her age, gender, education, and ethnicity (<1st percentile).  Trail Making Test   Part A)  Time=149.78", (1 Error), T=18, <1st %   Language:  The tone, prosody, and fluency of the patients speech were normal, with no clear errors or difficulty in speech and receptive language was intact. Her RBANS Language Index score of 71 was borderline impaired for her age (63rd percentile). Confrontation naming (RBANS Picture Naming) was borderline to low average (3rd-9th percentile) with 2 errors. Category fluency (RBANS Semantic Fluency) was also borderline impaired (5th percentile). Another test of category fluency Geologist, engineering) was severely impaired for her age, gender, education, and ethnicity (<1st percentile) notable for several repetitions. Letter fluency (COWA) was  also severely impaired (<1st percentile) with a couple repetitions and I intrusion.  Animal Naming Total=7, T=16, <1st % Repetitions=4 Intrusions=0  Controlled Oral Word Association Test (COWAT)  FAS total=15, T=24, <1st %  [place] Naming Test (BNT)  Total (w/ semantic cues) = 34/60, T=29, 2nd %  Total (w/phonemic cues) = 35/60  Visual Spatial Abilities:  Visuospatial ability was variable overall with areas of relative weakness and preserved function.  Her RBANS Visuospatial Index score of 81 was low average for her age (52th percentile). Copy of a geometric figure (RBANS Figure Copy) was borderline impaired (5th percentile) and visuoperception (RBANS Line Orientation) was average (26th to 50th percentile).  Clock Drawing Test   Below Expectation (<16th percentile)   Memory:  The patients RBANS Immediate Memory Index score of 53 was severely impaired for her age (<0.1st percentile). Immediate recall for a list of 10 words read to her 4 times (RBANS List Learning) was moderately impaired (1st percentile). Immediate recall for a story read 2 times (RBANS Story Memory) was severely impaired (<1st percentile).   The patients RBANS Delayed Memory Index score of 64 was moderately impaired for her age (1st percentile). Spontaneous recall for the list of words read to her after delay (RBANS List Recall) was low average (1 word, 10th-16th percentile) and yes/no recognition for the words (RBANS List Recognition) was also low average (17/20, 10th-16th percentile). Delayed recall for the story [facility] Story Recall) was severely impaired (<1st percentile). Spontaneous recall for the copied figure (RBANS Figure Recall) scored low average (9th percentile).   Executive Functioning:   As mentioned above, the patients performance was superior for basic auditory attention but very low for memory and verbal fluency. Behaviorally, she showed minimal social inappropriateness but did show impulsivity and some  set-loss. Also mentioned above, Trails A speed was severely impaired. When a switching component was added (Trails B), her performance was severely impaired (<1st percentile) as the task had to be discontinued after she ran out of time. She showed some difficulty understanding it and made numerous errors.  Summary of Test Results: Premorbid verbal intellectual abilities were estimated to have been within the average range based on a test of word reading. Psychomotor processing speed was slow and impaired. Basic auditory attention was superior. Visual-spatial construction was in the borderline range and relatively weak. Language abilities were relatively impaired. Specifically, confrontation naming was mildly impaired while semantic verbal fluency was profoundly impaired. With regard to verbal memory, encoding and acquisition of non-contextual information (i.e., word list) was mildly impaired. After a delay, free recall was low average. Performance on a yes/no recognition task was also low average. On another verbal memory test, encoding and acquisition of contextual auditory information (i.e., short story) was moderately impaired. After  a delay, free recall stayed the same and was also moderately impaired. With regard to non-verbal memory, delayed free recall of visual information was low average. Executive functioning was impaired overall. Mental flexibility and set-shifting were severely impaired on Trails B. She had significant trouble with visual attention including scanning and tracking. Verbal fluency with phonemic search restrictions was profoundly impaired. Performance on a clock drawing task was below expectation and relatively impaired.    Clinical Impression: Major neurocognitive disorder due to probable Alzheimer's disease, without behavioral disturbance ([place])  Results of the current cognitive evaluation reveal several areas of impairment, including verbal and non-verbal memory, semantic retrieval  (i.e., semantic fluency and confrontation naming), and mental flexibility and set-shifting. Additionally, there is evidence that her cognitive deficits are interfering with her ability to manage complex tasks, such as managing finances, cooking, and driving. As such, diagnostic criteria for a dementia syndrome are met.  Results appear generally consistent with patient and her son's report of memory changes and other cognitive and functional changes that appear to be getting progressively worse in the last 5 years. The patients cognitive profile is suggestive of medial-temporal lobe involvement. Alzheimers disease is the most likely etiology, given her cognitive profile, clinical features and moderate atrophy noted on neuroimaging. It is possible that depression along with significant anxiety and fear might playing a role, but to a lesser degree than is likely an underlying neurodegenerative process.    Recommendations: Based on the findings of the present evaluation, the following recommendations are offered:  1. While the patient did not respond well to Aricept, she may be an appropriate candidate for another cholinesterase inhibitor or new FDA approved drug (I.e., Aduhelm). She will follow up with Dr. Jaynee Eagles and Dr. Jacelyn Grip about this.  2. The patient should continue to receive assistance with complex ADLs, including finances and transportation. Given her poor performance on a cognitive test highly correlated with driving ability, it is my recommendation that she not return to driving. Finally, her son should monitor her medications to make sure they are being taken correctly.  3. Education regarding Alzheimers disease will be provided to the patient and her son. The patients family may wish to attend a local dementia caregiver support group and/or seek additional information from the [place] ([email or web address]). They may wish to seek additional resources through [employer]  (contact information was provided).  4. The patient should continue to participate in activities which provide mental stimulation, safe cardiovascular exercise, and social interaction.  5. Neuropsychological re-assessment in one year is recommended in order to monitor cognitive status, track progression of symptoms and further assist with treatment planning.  6. PoA, living will and estate planning should be completed, if these are not yet in place.   Total time: 60 minutes for integration of patient data, interpretation of standardized test results and clinical data, clinical decision making, treatment planning and preparation of this report (CPT code [id number])    Thank you for your referral of Duaa Stelzner. Please feel free to contact me if you have any questions or concerns regarding this report.   ___________________________     ___________________________   Joelyn Oms, Psy.D.      Ilean Skill, Psy.D. Clinical Neuropsychologist       Clinical Neuropsychologist

## 2019-12-06 ENCOUNTER — Other Ambulatory Visit: Payer: Self-pay

## 2019-12-06 ENCOUNTER — Encounter: Payer: Medicare Other | Admitting: Psychology

## 2019-12-06 DIAGNOSIS — F0390 Unspecified dementia without behavioral disturbance: Secondary | ICD-10-CM

## 2019-12-06 DIAGNOSIS — F0150 Vascular dementia without behavioral disturbance: Secondary | ICD-10-CM

## 2019-12-06 NOTE — Progress Notes (Signed)
Today I provided feedback to the patient, her son, and her daughter (via speeker phone).  Reviewed the results and recommendations.  This was an in person visit with patient and son.  She was able to understand and be an active participant with recommendations going forward.  Below you will find the summery and diagnostic information.  The full report can be found in her EMR dated 11/30/2019.     Summary of Test Results: Premorbid verbal intellectual abilities were estimated to have been within the average range based on a test of word reading. Psychomotor processing speed was slow and impaired. Basic auditory attention was superior. Visual-spatial construction was in the borderline range and relatively weak. Language abilities were relatively impaired. Specifically, confrontation naming was mildly impaired while semantic verbal fluency was profoundly impaired. With regard to verbal memory, encoding and acquisition of non-contextual information (i.e., word list) was mildly impaired. After a delay, free recall was low average. Performance on a yes/no recognition task was also low average. On another verbal memory test, encoding and acquisition of contextual auditory information (i.e., short story) was moderately impaired. After a delay, free recall stayed the same and was also moderately impaired. With regard to non-verbal memory, delayed free recall of visual information was low average. Executive functioning was impaired overall. Mental flexibility and set-shifting were severely impaired on Trails B. She had significant trouble with visual attention including scanning and tracking. Verbal fluency with phonemic search restrictions was profoundly impaired. Performance on a clock drawing task was below expectation and relatively impaired.    Clinical Impression: Major neurocognitive disorder due to probable Alzheimer's disease, without behavioral disturbance ([place])  Results of the current cognitive evaluation  reveal several areas of impairment, including verbal and non-verbal memory, semantic retrieval (i.e., semantic fluency and confrontation naming), and mental flexibility and set-shifting. Additionally, there is evidence that her cognitive deficits are interfering with her ability to manage complex tasks, such as managing finances, cooking, and driving. As such, diagnostic criteria for a dementia syndrome are met.  Results appear generally consistent with patient and her son's report of memory changes and other cognitive and functional changes that appear to be getting progressively worse in the last 5 years. The patient's cognitive profile is suggestive of medial-temporal lobe involvement. Alzheimer's disease is the most likely etiology, given her cognitive profile, clinical features and moderate atrophy noted on neuroimaging. It is possible that depression along with significant anxiety and fear might playing a role, but to a lesser degree than is likely an underlying neurodegenerative process.    Recommendations: Based on the findings of the present evaluation, the following recommendations are offered:  1. While the patient did not respond well to Aricept, she may be an appropriate candidate for another cholinesterase inhibitor or new FDA approved drug (I.e., Aduhelm). She will follow up with Dr. Jaynee Eagles and Dr. Jacelyn Grip about this.  2. The patient should continue to receive assistance with complex ADLs, including finances and transportation. Given her poor performance on a cognitive test highly correlated with driving ability, it is my recommendation that she not return to driving. Finally, her son should monitor her medications to make sure they are being taken correctly.  3. Education regarding Alzheimer's disease will be provided to the patient and her son. The patient's family may wish to attend a local dementia caregiver support group and/or seek additional information from the Alzheimer's  Association ([email or web address]). They may wish to seek additional resources through [employer] (contact information was provided).  4. The patient should continue to participate in activities which provide mental stimulation, safe cardiovascular exercise, and social interaction.  5. Neuropsychological re-assessment in one year is recommended in order to monitor cognitive status, track progression of symptoms and further assist with treatment planning.  6. PoA, living will and estate planning should be completed, if these are not yet in place.   Total time: 60 minutes forintegration of patient data, interpretationofstandardizedtest resultsand clinical data,clinical decision making,treatment planning andpreparation of this report (CPT code [id number])    Thank you for your referral of Candace Baldwin. Please feel free to contact me if you have any questions or concerns regarding this report.   ___________________________                                                     ___________________________  Joelyn Oms, Psy.D.                                                                  Ilean Skill, Psy.D. Clinical Neuropsychologist           Clinical Neuropsychologist

## 2020-01-02 ENCOUNTER — Ambulatory Visit: Payer: Medicare Other | Admitting: Neurology

## 2020-01-02 ENCOUNTER — Other Ambulatory Visit: Payer: Self-pay

## 2020-01-02 ENCOUNTER — Encounter: Payer: Self-pay | Admitting: Neurology

## 2020-01-02 VITALS — BP 122/60 | HR 78 | Ht 65.0 in | Wt 99.0 lb

## 2020-01-02 DIAGNOSIS — F0390 Unspecified dementia without behavioral disturbance: Secondary | ICD-10-CM

## 2020-01-02 DIAGNOSIS — F0281 Dementia in other diseases classified elsewhere with behavioral disturbance: Secondary | ICD-10-CM

## 2020-01-02 DIAGNOSIS — G301 Alzheimer's disease with late onset: Secondary | ICD-10-CM

## 2020-01-02 DIAGNOSIS — F02818 Dementia in other diseases classified elsewhere, unspecified severity, with other behavioral disturbance: Secondary | ICD-10-CM

## 2020-01-02 DIAGNOSIS — F0150 Vascular dementia without behavioral disturbance: Secondary | ICD-10-CM | POA: Diagnosis not present

## 2020-01-02 NOTE — Progress Notes (Signed)
[facility]    Provider:  Dr Jaynee Eagles Requesting Provider: Vernie Shanks, MD Primary Care Provider:  Vernie Shanks, MD  CC:  Alzheimer's dementia  Interval history 01/02/2020: Patient is here for follow-up on dementia.  She is here with extended family including family from out of town to discuss.  She was diagnosed by formal neurocognitive testing.  She was diagnosed with major neurocognitive disorder due to probable Alzheimer's disease, without behavioral disturbance.  We discussed the findings which included several areas of impairment including verbal and nonverbal memory, semantic fluency and confrontation naming, and mental flexibility, cognitive deficits are interfering with her ability to manage complex task such as managing finances, cooking and driving, fitting a diagnostic to treat area for major neurocognitive disorder due to a dementia syndrome, progressively worsening over the last 5 years.  Her cognitive profile is suggestive of medial temporal lobe involvement.  Also moderate atrophy noted her neuroimaging was supportive of this diagnosis.  There also may be depression in diagnosis playing a role.  Recommendations include trying another cholinesterase inhibitor, the new infusion medication for dementia, she may continue to receive assistance with complex ADLs and consider a memory unit at some point, help with medications, POA living and estate planning should be completed, we discussed all of these above including participating in activities, reassessing in a year approximately, I also provided them with multiple resources.  We reviewed power of attorney, healthcare power of attorney, I recommended contacting [facility] or other agency to try to get people to help with her management of medications, we discussed restarting donepezil or memantine but at this time really need a plan for use can help her manage her medications, we discussed memory units, I answered all  questions, extended family visit.    HPI:  Candace Baldwin is a 84 y.o. female here as requested by Vernie Shanks, MD for cognitive decline. PMHx adjustment d/o with anxiety and depression, osteoarthritis.  I reviewed Dr. Jodi Mourning notes, son says she has been diagnosed with cognitive impairment, recently complaining of being "foggy headed ", she thinks it might be her meds her citalopram and maybe even the lisinopril, diagnosed with age-related cognitive decline, she is still able to be at home, she can bathe and feed herself as long she does not have to cook, son is looking into home care retirement communities, she was placed on the Aricept and she did have some side effects nausea and vomiting and the Aricept was then ordered half tab and the citalopram was decreased to 10 mg.  At last appointment patient said that she was digressing backwards rapidly, only occasional social activity, she has a PhD from [place].  Last lab work in September 2020 which was unremarkable CBC, CMP with BUN 27 and creatinine 0.81, B12 was 312, TSH 1.04, vitamin D 46.4.  She has a PhD from [place]. She says 2 years ago she started forgetting things, forgetting where her purse was for example. She bought a pad and started writing things down and after that she eliminated the problem, she started putting things in a certain place. She said this was frightening when it happened. 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 Now if she is tired it can be worse. She feels she kinda stopped it but she is getting tired of writing things down. She lives with her dog, she has a town home, her flowers are lovely and she has a shade garden. She says she pays her  own bills She has forgotten to pay bills. Her son is concerned about her memory.  Her son's girlfriend offered to help and is now paying the bills.She is not taking anything daily as far as meds go. She states she never misses appointments. She declines her son coming in  with her and I suspect history is lacking and there are more deficits than patient is telling me.She denies dementia in the family. Son says its more, confusing, agitation, more memory loss than stated.   Reviewed notes, labs and imaging from outside physicians, which showed: I reviewed images from 2005 CT head and agree with the following:  1. Negative for intracranial process or bleed.  2. Mild atrophy with nonspecific white matter changes in the right frontal lobe.   3. Minimal sphenoid sinus disease.        Review of Systems: Patient complains of symptoms per HPI as well as the following symptoms:alzheimers. Pertinent negatives and positives per HPI. All others negative.   Social History   Socioeconomic History  . Marital status: Divorced    Spouse name: Not on file  . Number of children: 2  . Years of education: Not on file  . Highest education level: Doctorate  Occupational History  . Occupation: Retired    Comment: Advertising account planner from [employer]  . Smoking status: Never Smoker  . Smokeless tobacco: Never Used  Substance and Sexual Activity  . Alcohol use: Yes    Comment: rarely wine   . Drug use: Never  . Sexual activity: Not on file  Other Topics Concern  . Not on file  Social History Narrative   LIves at home alone   Caffeine: occasionally hot tea    Social Determinants of Health   Financial Resource Strain:   . Difficulty of Paying Living Expenses:   Food Insecurity:   . Worried About Charity fundraiser in the Last Year:   . Arboriculturist in the Last Year:   Transportation Needs:   . Film/video editor (Medical):   Marland Kitchen Lack of Transportation (Non-Medical):   Physical Activity:   . Days of Exercise per Week:   . Minutes of Exercise per Session:   Stress:   . Feeling of Stress :   Social Connections:   . Frequency of Communication with Friends and Family:   . Frequency of Social Gatherings with Friends and Family:   . Attends Religious Services:    . Active Member of Clubs or Organizations:   . Attends Archivist Meetings:   Marland Kitchen Marital Status:   Intimate Partner Violence:   . Fear of Current or Ex-Partner:   . Emotionally Abused:   Marland Kitchen Physically Abused:   . Sexually Abused:     Family History  Problem Relation Age of Onset  . Heart failure Father   . Other Father        ?heart attack  . Rheum arthritis Paternal Grandmother        Aunt x 2  . Breast cancer Mother   . Cancer Sister   . Arthritis Sister   . Diabetes Brother   . Dementia Neg Hx     Past Medical History:  Diagnosis Date  . Allergic rhinitis   . ALLERGIC RHINITIS 10/29/2007   Qualifier: Diagnosis of  By: Jenny Reichmann MD, Hunt Oris   . BCC (basal cell carcinoma) 09/01/1978   Right upper chest (Dr Radford Pax)  . BCC (basal cell carcinoma) 07/23/1981   Left  Neck (Dr. Radford Pax)  . BCC (basal cell carcinoma) 03/17/1987   neck (Dr. Radford Pax)  . BCC (basal cell carcinoma) 08/14/1989   Left malar area, left clavicle (Dr. Radford Pax)  . BCC (basal cell carcinoma) 07/22/1985   Left neck, Left nose (Dr. Radford Pax)  . BCC (basal cell carcinoma) 04/27/1990   left upperarm  . BCC (basal cell carcinoma) 05/11/1991   right chest (Dr. Radford Pax)  . BCC (basal cell carcinoma) 02/02/2008   above right upper lip (MOHS), Behind Left ear (CX35FU)  . BCC (basal cell carcinoma) 05/13/2017   Right neck  . Chronic hoarseness 06/04/2011   Onset winter of 2011-2012   . DEGENERATIVE JOINT DISEASE, HANDS 01/23/2007   Qualifier: Diagnosis of  By: Jenny Reichmann MD, Hunt Oris   . Diverticulosis   . DIVERTICULOSIS, COLON 01/23/2007   Qualifier: Diagnosis of  By: Jenny Reichmann MD, Hunt Oris   . DJD (degenerative joint disease)   . Heart murmur   . Hyperlipidemia   . HYPERLIPIDEMIA 01/23/2007   Qualifier: Diagnosis of  By: Jenny Reichmann MD, Hunt Oris   . Osteoporosis   . OSTEOPOROSIS 10/29/2007   Qualifier: Diagnosis of  By: Jenny Reichmann MD, Hunt Oris   . Shingles   . SKIN CANCER, HX OF 01/23/2007   Qualifier: Diagnosis of  By: Jenny Reichmann MD, Hunt Oris     Patient Active Problem List   Diagnosis Date Noted  . Late onset Alzheimer's disease with behavioral disturbance ([place]) 01/03/2020  . Major neurocognitive disorder ([place]) 09/13/2019  . Diverticulitis 05/29/2015  . Dehydration 05/29/2015  . Nausea and vomiting 05/29/2015  . Superior mesenteric artery stenosis ([place]) 05/29/2015  . Hyperglycemia 05/29/2015  . Acute exacerbation of COPD with asthma ([place]) 08/06/2012  . Preventative health care 09/06/2011  . Chronic hoarseness 06/04/2011  . SHINGLES 04/12/2008  . ALLERGIC RHINITIS 10/29/2007  . OSTEOPOROSIS 10/29/2007  . HYPERLIPIDEMIA 01/23/2007  . DIVERTICULOSIS, COLON 01/23/2007  . DEGENERATIVE JOINT DISEASE, HANDS 01/23/2007  . SKIN CANCER, HX OF 01/23/2007  . CARDIAC MURMUR, HX OF 01/23/2007    Past Surgical History:  Procedure Laterality Date  . CATARACT EXTRACTION, BILATERAL    . FOOT SURGERY  2011   Bunion with prolonged recovery  . MOHS SURGERY     Nose  . VAGINAL HYSTERECTOMY  1990    Current Outpatient Medications  Medication Sig Dispense Refill  . Aclidinium Bromide (TUDORZA PRESSAIR) 400 MCG/ACT AEPB Inhale 1 puff into the lungs 2 (two) times daily. (Patient not taking: Reported on 01/02/2020) 1 each 0  . albuterol (PROAIR HFA) 108 (90 BASE) MCG/ACT inhaler Inhale 2 puffs into the lungs 4 (four) times daily as needed. (Patient not taking: Reported on 01/02/2020) 1 Inhaler 11   No current facility-administered medications for this visit.    Allergies as of 01/02/2020 - Review Complete 01/02/2020  Allergen Reaction Noted  . Lipitor [atorvastatin]  09/09/2010  . Montelukast sodium    . Penicillins  01/23/2007  . Simvastatin  09/09/2010  . Sulfonamide derivatives  01/23/2007    Vitals: BP 122/60 (BP Location: Right Arm, Patient Position: Sitting)   Pulse 78   Ht 5\' 5"  (1.651 m)   Wt 99 lb (44.9 kg)   SpO2 98%   BMI 16.47 kg/m  Last Weight:  Wt Readings from Last 1 Encounters:  01/02/20 99 lb (44.9 kg)    Last Height:   Ht Readings from Last 1 Encounters:  01/02/20 5\' 5"  (1.651 m)     Physical exam: stable Exam: Gen: NAD, conversant,Tangential, thin,  well groomed                     CV: RRR, no MRG. No Carotid Bruits. No peripheral edema, warm, nontender Eyes: Conjunctivae clear without exudates or hemorrhage  Neuro: Detailed Neurologic Exam  Speech:    Speech is normal; fluent and spontaneous with normal comprehension.  Cognition:     MMSE - Mini Mental State Exam 09/13/2019  Orientation to time 4  Orientation to Place 2  Registration 3  Attention/ Calculation 1  Recall 0  Language- name 2 objects 2  Language- repeat 1  Language- follow 3 step command 3  Language- read & follow direction 1  Write a sentence 1  Copy design 0  Total score 18    Cranial Nerves:    The pupils are equal, round, and reactive to light. Attempted fundoscopy could not visualize due to amall puils.  Visual fields are full to finger confrontation. Extraocular movements are intact. Trigeminal sensation is intact and the muscles of mastication are normal. The face is symmetric. The palate elevates in the midline. Hearing intact. Voice is normal. Shoulder shrug is normal. The tongue has normal motion without fasciculations.   Coordination:    Normal finger to nose  Gait:    Normal native gait  Motor Observation:    No asymmetry, no atrophy, and no involuntary movements noted. Tone:    Normal muscle tone.    Posture:    Posture is normal. normal erect    Strength:    Strength is V/V in the upper and lower limbs.      Sensation: intact to LT     Reflex Exam:  DTR's: Absent AJs otherwise deep tendon reflexes in the upper and lower extremities are normal bilaterally.   Toes:    The toes are downgoing bilaterally.   Clonus:    Clonus is absent.    Assessment/Plan: Really lovely 84 year old with no significant past medical history, highly educated she has a PhD from [employer], with cognitive  decline likely major neurocognitive disorder.    Extended family meeting with son and daughter, daughter drove from [place] to be here today, recently diagnosed with Alzheimer's through formal memory testing, we discussed dementia, power of attorney, healthcare power of attorney, given the multiple resources for having people help her, discussed memory units, we discussed findings on formal memory testing, we reviewed MRI of the brain which showed generalized cortical atrophy most pronounced in the medial temporal lobes which is consistent with Alzheimer's but she also has extensive chronic microvascular ischemic changes which can also cause memory loss, I gave him literature to read about these particular conditions, I discussed diet, also answered daughter's questions about hereditary risk for Alzheimer's and ways we can help prevent dementia, provided them with a book "the XX brain", discussed the M IND diet out of rash, answered all questions, extended 120-minute appointment.  No orders of the defined types were placed in this encounter.   Cc: Vernie Shanks, MD   I spent 120  minutes of face-to-face and non-face-to-face time with patient on the  1. Major neurocognitive disorder ([place])   2. Late onset Alzheimer's disease with behavioral disturbance ([place])    diagnosis.  This included previsit chart review, lab review, study review, order entry, electronic health record documentation, patient education on the different diagnostic and therapeutic options, counseling and coordination of care, risks and benefits of management, compliance, or risk factor reduction   Sarina Ill, MD  [facility] [address] [place], [place] [address]  Phone [phone] Fax [phone]

## 2020-01-02 NOTE — Patient Instructions (Signed)
Recommendations to prevent or slow progression of cognitive decline:   Exercise You should increase exercise 30 to 45 minutes per day at least 3 days a week although 5 to 7 would be preferred. Any type of exercise (including walking) is acceptable although a recumbent bicycle may be best if you are unsteady. Disease related apathy can be a significant roadblock to exercise and the only way to overcome this is to make it a daily routine and perhaps have a reward at the end (something your loved one loves to eat or drink perhaps) or a personal trainer coming to the home can also be very useful. In general a structured, repetitive schedule is best.   Cardiovascular Health: You should optimize all cardiovascular risk factors (blood pressure, sugar, cholesterol) as vascular disease such as strokes and heart attacks can make memory problems much worse.   Diet: Eating a heart healthy (Mediterranean) diet is also a good idea; fish and poultry instead of red meat, nuts (mostly non-peanuts), vegetables, fruits, olive oil or canola oil (instead of butter), minimal salt (use other spices to flavor foods), whole grain rice, bread, cereal and pasta and wine in moderation.  General Health: Any diseases which effect your body will effect your brain such as a pneumonia, urinary infection, blood clot, heart attack or stroke. Keep contact with your primary care doctor for regular follow ups.  Sleep. A good nights sleep is healthy for the brain. Seven hours is recommended. If you have insomnia or poor sleep habits see the recommendations below  Tips: Structured and consistent daytime and nighttime routine, including regular wake times, bedtimes, and mealtimes, will be important for the patient to avoid confusion. Keeping frequently used items in designated places will help reduce stress from searching. If there are worries about getting lost do not let the patient leave home unaccompanied. They might benefit from wearing  an identification bracelet that will help others assist in finding home if they become lost. Information about nationwide safe return services and other helpful resources may be obtained through the Alzheimer's Association helpline at [phone].  Finances, Power of Producer, television/film/video Directives: You should consider putting legal safeguards in place with regard to financial and medical decision making. While the spouse always has power of attorney for medical and financial issues in the absence of any form, you should consider what you want in case the spouse / caregiver is no longer around or capable of making decisions.   [place] : http://www.welch.com/.pdf  Or Google "[place]" AND "An Forensic scientist for [employer]  Other States: ApartmentMom.com.ee  The signature on these forms should be notarized.   DRIVING:  Recommend no driving  If you would like to be tested to see if you are driving safely, [facility] has a Clinical Driving Evaluation. To schedule an appointment call [phone]. There are also others driving evaluation programs.                RESOURCES:  Memory Loss: Improve your short term memory By Silvio Pate  The Alzheimer's Reading Room http://www.alzheimersreadingroom.com/   The Alzheimer's Compendium http://www.alzcompend.info/  [employer] www.dukefamilysupport.GLO 617-620-8895  Recommended resources for caregivers (All can be purchased on [employer]):  1) A Caregiver's Guide to Dementia: Using Activities and Other Strategies to Prevent, Reduce and Manage Behavioral Symptoms by Osie Bond. Gitlin and [employer]   2) A Caregiver's Guide to [employer] Dementia by Caleen Essex MS BSN and Gaston Islam   3) What If  It's Not Alzheimer's?: A Caregiver's Guide to Dementia by Koren Shiver  (Author), Octaviano Batty (Editor)  3) The 36 hour day by Rabins and Mace  4) Understanding Difficult Behaviors by Merita Norton and White  Online course for helping caregivers reduce stress, guilt and frustration called the Caregivers Helpbook. The website is www.powerfultoolsforcaregivers.org  As a caregiver you are a Art gallery manager. Problems you face as a caregiver are usually unique to your situation and the way your loved-one's disease manifests itself. The best way to use these books is to look at the Table of Contents and read any chapters of interest or that apply to challenges you are having as a caregiver.  NATIONAL RESOURCES: For more information on neurological disorders or research programs funded by the [employer] of Neurological Disorders and Stroke, contact the Institute's Agricultural consultant (BRAIN) at: [place] [address] [phone] (toll-free) MasterBoxes.it  Information on dementia is also available from the following organizations: Alzheimer's Disease Education and Referral (So-Hi) [place] [facility] [address] [place] [address] [phone] (toll-free) DVDEnthusiasts.nl  Alzheimer's Association [address] [place] [address] [phone] (toll-free, 24-hour helpline) 8632755142 (TDD) CapitalMile.co.nz  Alzheimer's Foundation of America [address], [place] [address] [phone] (toll-free) www.alzfdn.org  [facility] [address], [place] [address] [phone] www.alzdiscovery.org  Association for [address], [place] [address] [phone] (toll-free) [place] [address] [phone]  (toll-free) www.brightfocus.org/alzheimers  Doran Stabler [facility] Alzheimer's Foundation [address], [place] [address] [phone] www.https://lambert-jackson.net/  Lewy Body Dementia Association [address] [place] [address] [phone] (toll-free LBD Caregiver Link) www.lbda.[place], [place], [place] [address] [phone] (toll-free) [phone] [facility]) https://carter.com/  National Organization for Rare Disorders [address] [place] [address] 7-017-793-JQZE [phone]) (toll-free) www.rarediseases.org  The Dementias: Hope Through Research was jointly produced by the [employer] of Neurological Disorders and Stroke (NINDS) and the [employer] on Aging (NIA), both part of the [employer], the [employer] research agency--supporting scientific studies that turn discovery into health. NINDS is the nation's leading funder of research on the brain and nervous system. The NINDS mission is to reduce the burden of neurological disease. For more information and resources, visit MasterBoxes.it [1] or call 704-001-6000. NIA leads the federal government effort conducting and supporting research on aging and the health and well-being of older people. NIA's Alzheimer's Disease Education and Referral (ADEAR) Center offers information and publications on dementia and caregiving for families, caregivers, and professionals. For more information, visit DVDEnthusiasts.nl [2] or call ([phone]. Also available from NIA are publications and information about Alzheimer's disease as well as the booklets Frontotemporal Disorders: Information for Patients, Families, and Caregivers and Lewy Body Dementia: Information for Patients, Families, and Professionals. Source URL:  SocialSpecialists.co.nz

## 2020-01-03 ENCOUNTER — Telehealth: Payer: Self-pay | Admitting: *Deleted

## 2020-01-03 DIAGNOSIS — G301 Alzheimer's disease with late onset: Secondary | ICD-10-CM | POA: Insufficient documentation

## 2020-01-03 NOTE — Telephone Encounter (Signed)
Per Dr. Jaynee Eagles, refer to [place]. Form completed and then was signed by MD. Virgel Gess the referral to [employer]. Received a receipt of confirmation.

## 2020-01-17 ENCOUNTER — Ambulatory Visit: Payer: Medicare Other | Admitting: Psychology

## 2020-01-18 ENCOUNTER — Ambulatory Visit: Payer: Medicare Other | Admitting: Neurology

## 2020-02-07 ENCOUNTER — Ambulatory Visit: Payer: Medicare Other | Admitting: Psychology

## 2020-02-09 ENCOUNTER — Ambulatory Visit: Payer: Self-pay | Admitting: Neurology

## 2020-05-14 ENCOUNTER — Ambulatory Visit: Payer: Medicare Other | Admitting: Neurology

## 2020-05-14 ENCOUNTER — Encounter: Payer: Self-pay | Admitting: Neurology

## 2020-05-14 ENCOUNTER — Other Ambulatory Visit: Payer: Self-pay

## 2020-05-14 VITALS — BP 126/75 | HR 78 | Ht 65.0 in | Wt 100.0 lb

## 2020-05-14 DIAGNOSIS — G301 Alzheimer's disease with late onset: Secondary | ICD-10-CM

## 2020-05-14 DIAGNOSIS — F0281 Dementia in other diseases classified elsewhere with behavioral disturbance: Secondary | ICD-10-CM | POA: Diagnosis not present

## 2020-05-14 MED ORDER — RIVASTIGMINE 4.6 MG/24HR TD PT24: 4.6000 mg | MEDICATED_PATCH | Freq: Every day | TRANSDERMAL | 12 refills | Status: DC

## 2020-05-14 NOTE — Progress Notes (Signed)
[facility]    Provider:  Dr Jaynee Eagles Requesting Provider: Vernie Shanks, MD Primary Care Provider:  Vernie Shanks, MD  CC:  Alzheimer's dementia   Interval history 05/14/2020: Here for follow up on Alzheimer's dementia diagnosed by formal neurocognitive testing. Here with son and daughter who is on the phone. We discussed the new Aduhelm and the Healthy Brain study the family is interested in discussing Aduhelm with a memory clinic although we did discuss how controversial it is currently in the neurologic community.  She is enjoying [place], we discussed [employer], they are also looking for additional resources to help her during the day, we discussed Alzheimer's again and I answered any questions they may have, we also discussed a memory unit and the children possibly participating in clinical trials, patient is also interested in clinical trials and possibly trying the new IV dementia medication.  I will send to [facility].   Interval history 01/02/2020: Patient is here for follow-up on dementia.  She is here with extended family including family from out of town to discuss.  She was diagnosed by formal neurocognitive testing.  She was diagnosed with major neurocognitive disorder due to probable Alzheimer's disease, without behavioral disturbance.  We discussed the findings which included several areas of impairment including verbal and nonverbal memory, semantic fluency and confrontation naming, and mental flexibility, cognitive deficits are interfering with her ability to manage complex task such as managing finances, cooking and driving, fitting a diagnostic to treat area for major neurocognitive disorder due to a dementia syndrome, progressively worsening over the last 5 years.  Her cognitive profile is suggestive of medial temporal lobe involvement.  Also moderate atrophy noted her neuroimaging was supportive of this diagnosis.  There also may be depression in  diagnosis playing a role.  Recommendations include trying another cholinesterase inhibitor, the new infusion medication for dementia, she may continue to receive assistance with complex ADLs and consider a memory unit at some point, help with medications, POA living and estate planning should be completed, we discussed all of these above including participating in activities, reassessing in a year approximately, I also provided them with multiple resources.  We reviewed power of attorney, healthcare power of attorney, I recommended contacting [facility] or other agency to try to get people to help with her management of medications, we discussed restarting donepezil or memantine but at this time really need a plan for use can help her manage her medications, we discussed memory units, I answered all questions, extended family visit.    HPI:  Alyzza Andringa is a 84 y.o. female here as requested by Vernie Shanks, MD for cognitive decline. PMHx adjustment d/o with anxiety and depression, osteoarthritis.  I reviewed Dr. Jodi Mourning notes, son says she has been diagnosed with cognitive impairment, recently complaining of being "foggy headed ", she thinks it might be her meds her citalopram and maybe even the lisinopril, diagnosed with age-related cognitive decline, she is still able to be at home, she can bathe and feed herself as long she does not have to cook, son is looking into home care retirement communities, she was placed on the Aricept and she did have some side effects nausea and vomiting and the Aricept was then ordered half tab and the citalopram was decreased to 10 mg.  At last appointment patient said that she was digressing backwards rapidly, only occasional social activity, she has a PhD from [place].  Last lab work in September 2020  which was unremarkable CBC, CMP with BUN 27 and creatinine 0.81, B12 was 312, TSH 1.04, vitamin D 46.4.  She has a PhD from [place]. She says 2 years ago she started  forgetting things, forgetting where her purse was for example. She bought a pad and started writing things down and after that she eliminated the problem, she started putting things in a certain place. She said this was frightening when it happened. 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 Now if she is tired it can be worse. She feels she kinda stopped it but she is getting tired of writing things down. She lives with her dog, she has a town home, her flowers are lovely and she has a shade garden. She says she pays her own bills She has forgotten to pay bills. Her son is concerned about her memory.  Her son's girlfriend offered to help and is now paying the bills.She is not taking anything daily as far as meds go. She states she never misses appointments. She declines her son coming in with her and I suspect history is lacking and there are more deficits than patient is telling me.She denies dementia in the family. Son says its more, confusing, agitation, more memory loss than stated.   Reviewed notes, labs and imaging from outside physicians, which showed: I reviewed images from 2005 CT head and agree with the following:  1. Negative for intracranial process or bleed.  2. Mild atrophy with nonspecific white matter changes in the right frontal lobe.   3. Minimal sphenoid sinus disease.        Review of Systems: Patient complains of symptoms per HPI as well as the following symptoms:alzheimers. Pertinent negatives and positives per HPI. All others negative.   Social History   Socioeconomic History  . Marital status: Divorced    Spouse name: Not on file  . Number of children: 2  . Years of education: Not on file  . Highest education level: Doctorate  Occupational History  . Occupation: Retired    Comment: Advertising account planner from [employer]  . Smoking status: Never Smoker  . Smokeless tobacco: Never Used  Substance and Sexual Activity  . Alcohol use: Yes    Comment: rarely  wine   . Drug use: Never  . Sexual activity: Not on file  Other Topics Concern  . Not on file  Social History Narrative   LIves at [employer] in independent living    Caffeine: occasionally hot tea    Social Determinants of Health   Financial Resource Strain:   . Difficulty of Paying Living Expenses: Not on file  Food Insecurity:   . Worried About Charity fundraiser in the Last Year: Not on file  . Ran Out of Food in the Last Year: Not on file  Transportation Needs:   . Lack of Transportation (Medical): Not on file  . Lack of Transportation (Non-Medical): Not on file  Physical Activity:   . Days of Exercise per Week: Not on file  . Minutes of Exercise per Session: Not on file  Stress:   . Feeling of Stress : Not on file  Social Connections:   . Frequency of Communication with Friends and Family: Not on file  . Frequency of Social Gatherings with Friends and Family: Not on file  . Attends Religious Services: Not on file  . Active Member of Clubs or Organizations: Not on file  . Attends Archivist Meetings: Not on  file  . Marital Status: Not on file  Intimate Partner Violence:   . Fear of Current or Ex-Partner: Not on file  . Emotionally Abused: Not on file  . Physically Abused: Not on file  . Sexually Abused: Not on file    Family History  Problem Relation Age of Onset  . Heart failure Father   . Other Father        ?heart attack  . Rheum arthritis Paternal Grandmother        Aunt x 2  . Breast cancer Mother   . Cancer Sister   . Arthritis Sister   . Diabetes Brother   . Dementia Neg Hx     Past Medical History:  Diagnosis Date  . Allergic rhinitis   . ALLERGIC RHINITIS 10/29/2007   Qualifier: Diagnosis of  By: Jenny Reichmann MD, Hunt Oris   . BCC (basal cell carcinoma) 09/01/1978   Right upper chest (Dr Radford Pax)  . BCC (basal cell carcinoma) 07/23/1981   Left Neck (Dr. Radford Pax)  . BCC (basal cell carcinoma) 03/17/1987   neck (Dr. Radford Pax)  . BCC (basal cell  carcinoma) 08/14/1989   Left malar area, left clavicle (Dr. Radford Pax)  . BCC (basal cell carcinoma) 07/22/1985   Left neck, Left nose (Dr. Radford Pax)  . BCC (basal cell carcinoma) 04/27/1990   left upperarm  . BCC (basal cell carcinoma) 05/11/1991   right chest (Dr. Radford Pax)  . BCC (basal cell carcinoma) 02/02/2008   above right upper lip (MOHS), Behind Left ear (CX35FU)  . BCC (basal cell carcinoma) 05/13/2017   Right neck  . Chronic hoarseness 06/04/2011   Onset winter of 2011-2012   . DEGENERATIVE JOINT DISEASE, HANDS 01/23/2007   Qualifier: Diagnosis of  By: Jenny Reichmann MD, Hunt Oris   . Diverticulosis   . DIVERTICULOSIS, COLON 01/23/2007   Qualifier: Diagnosis of  By: Jenny Reichmann MD, Hunt Oris   . DJD (degenerative joint disease)   . Heart murmur   . Hyperlipidemia   . HYPERLIPIDEMIA 01/23/2007   Qualifier: Diagnosis of  By: Jenny Reichmann MD, Hunt Oris   . Osteoporosis   . OSTEOPOROSIS 10/29/2007   Qualifier: Diagnosis of  By: Jenny Reichmann MD, Hunt Oris   . Shingles   . SKIN CANCER, HX OF 01/23/2007   Qualifier: Diagnosis of  By: Jenny Reichmann MD, Hunt Oris     Patient Active Problem List   Diagnosis Date Noted  . Late onset Alzheimer's disease with behavioral disturbance ([place]) 01/03/2020  . Major neurocognitive disorder ([place]) 09/13/2019  . Diverticulitis 05/29/2015  . Dehydration 05/29/2015  . Nausea and vomiting 05/29/2015  . Superior mesenteric artery stenosis ([place]) 05/29/2015  . Hyperglycemia 05/29/2015  . Acute exacerbation of COPD with asthma ([place]) 08/06/2012  . Preventative health care 09/06/2011  . Chronic hoarseness 06/04/2011  . SHINGLES 04/12/2008  . ALLERGIC RHINITIS 10/29/2007  . OSTEOPOROSIS 10/29/2007  . HYPERLIPIDEMIA 01/23/2007  . DIVERTICULOSIS, COLON 01/23/2007  . DEGENERATIVE JOINT DISEASE, HANDS 01/23/2007  . SKIN CANCER, HX OF 01/23/2007  . CARDIAC MURMUR, HX OF 01/23/2007    Past Surgical History:  Procedure Laterality Date  . CATARACT EXTRACTION, BILATERAL    . FOOT SURGERY  2011   Bunion with  prolonged recovery  . MOHS SURGERY     Nose  . VAGINAL HYSTERECTOMY  1990    Current Outpatient Medications  Medication Sig Dispense Refill  . rivastigmine (EXELON) 4.6 mg/24hr Place 1 patch (4.6 mg total) onto the skin daily. 30 patch 12   No current facility-administered medications  for this visit.    Allergies as of 05/14/2020 - Review Complete 05/14/2020  Allergen Reaction Noted  . Lipitor [atorvastatin]  09/09/2010  . Montelukast sodium    . Penicillins  01/23/2007  . Simvastatin  09/09/2010  . Sulfonamide derivatives  01/23/2007    Vitals: BP 126/75 (BP Location: Right Arm, Patient Position: Sitting)   Pulse 78   Ht 5\' 5"  (1.651 m)   Wt 100 lb (45.4 kg)   BMI 16.64 kg/m  Last Weight:  Wt Readings from Last 1 Encounters:  05/14/20 100 lb (45.4 kg)   Last Height:   Ht Readings from Last 1 Encounters:  05/14/20 5\' 5"  (1.651 m)     Physical exam: stable Exam: Gen: NAD, conversant,Tangential, thin, well groomed                     CV: RRR, no MRG. No Carotid Bruits. No peripheral edema, warm, nontender Eyes: Conjunctivae clear without exudates or hemorrhage  Neuro: Detailed Neurologic Exam  Speech:    Speech is normal; fluent and spontaneous with normal comprehension.  Cognition:     MMSE - Mini Mental State Exam 09/13/2019  Orientation to time 4  Orientation to Place 2  Registration 3  Attention/ Calculation 1  Recall 0  Language- name 2 objects 2  Language- repeat 1  Language- follow 3 step command 3  Language- read & follow direction 1  Write a sentence 1  Copy design 0  Total score 18    Cranial Nerves:    The pupils are equal, round, and reactive to light. Attempted fundoscopy could not visualize due to amall puils.  Visual fields are full to finger confrontation. Extraocular movements are intact. Trigeminal sensation is intact and the muscles of mastication are normal. The face is symmetric. The palate elevates in the midline. Hearing intact.  Voice is normal. Shoulder shrug is normal. The tongue has normal motion without fasciculations.   Coordination:    Normal finger to nose  Gait:    Normal native gait  Motor Observation:    No asymmetry, no atrophy, and no involuntary movements noted. Tone:    Normal muscle tone.    Posture:    Posture is normal. normal erect    Strength:    Strength is V/V in the upper and lower limbs.      Sensation: intact to LT     Reflex Exam:  DTR's: Absent AJs otherwise deep tendon reflexes in the upper and lower extremities are normal bilaterally.   Toes:    The toes are downgoing bilaterally.   Clonus:    Clonus is absent.    Assessment/Plan: Really lovely 84 year old with no significant past medical history, highly educated she has a PhD from [facility], with diagnosed Alzheimer's dementia  -She did not tolerate Aricept, will start rivastigmine patch.  We can slowly titrate and then consider Namenda -Patient has a PhD from [facility], she is very motivated to participate in clinical trials, she would also consider treatment with the new biogen Aduhelm.  I will refer her to [facility] -her children are also interested in the healthy brain study, when I refer patient there they can ask about how they participate in this trial. - doing well at [place].  - They would like referral to [facility] for all of the above, happy to refer and continue to co-manage with [facility]  Prior visit Extended family meeting with son and daughter, daughter drove from [place] to be  here today, recently diagnosed with Alzheimer's through formal memory testing, we discussed dementia, power of attorney, healthcare power of attorney, given the multiple resources for having people help her, discussed memory units, we discussed findings on formal memory testing, we reviewed MRI of the brain which showed generalized cortical atrophy most pronounced in the medial temporal lobes which is consistent with Alzheimer's but she also  has extensive chronic microvascular ischemic changes which can also cause memory loss, I gave him literature to read about these particular conditions, I discussed diet, also answered daughter's questions about hereditary risk for Alzheimer's and ways we can help prevent dementia, provided them with a book "the XX brain", discussed the M IND diet out of rash, answered all questions, extended 120-minute appointment.  Orders Placed This Encounter  Procedures  . Ambulatory referral to Neurology    Cc: Vernie Shanks, MD   I spent over 60 minutes of face-to-face and non-face-to-face time with patient on the  1. Late onset Alzheimer's dementia with behavioral disturbance ([place])    diagnosis.  This included previsit chart review, lab review, study review, order entry, electronic health record documentation, patient education on the different diagnostic and therapeutic options, counseling and coordination of care, risks and benefits of management, compliance, or risk factor reduction   Sarina Ill, MD  [facility] [address] [place], [place] [address]  Phone [phone] Fax [phone]

## 2020-05-14 NOTE — Patient Instructions (Addendum)
[facility] referral - research? Aduhelm? Start Rivastigmine patch and titrate to highest dose Then start Namenda Rivastigmine skin patches What is this medicine? RIVASTIGMINE (ri va STIG meen) is used to treat mild to moderate dementia caused by Parkinson's disease and mild to severe Alzheimer's disease. This medicine may be used for other purposes; ask your health care provider or pharmacist if you have questions. COMMON BRAND NAME(S): Exelon Patch What should I tell my health care provider before I take this medicine? They need to know if you have any of these conditions:  application site reaction during previous use of rivastigmine patch  heart disease  kidney disease  liver disease  lung or breathing disease, like asthma  seizures  slow, irregular heartbeat  stomach or intestine disease, ulcers, or stomach bleeding  trouble passing urine  an unusual or allergic reaction to rivastigmine, other medicines, foods, dyes, or preservatives  pregnant or trying to get pregnant  breast-feeding How should I use this medicine? This medicine is for external use only. Follow the directions on the prescription label. Always remove the old patch before you apply a new one. Apply to skin right after removing the protective liner. Do not cut or trim the patch. Apply to an area of the upper arm, chest, or back that is clean, dry and hairless. Avoid injured, irritated, oily, or calloused areas or where the patch will be rubbed by tight clothing or a waistband. Do not place over an area where lotion, cream, or powder was recently used. Press firmly in place until the edges stick well. To prevent skin irritiation, do not apply to the same place more than once every 14 days. Change your patch at the same time each day. Do not use more often than directed. Do not stop using except on your doctor's advice. Contact your pediatrician or health care professional regarding the use of this medicine in  children. Special care may be needed. Overdosage: If you think you have taken too much of this medicine contact a poison control center or emergency room at once. NOTE: This medicine is only for you. Do not share this medicine with others. What if I miss a dose? If you miss a dose, apply a new patch immediately. Then, apply the next patch at the usual time the next day after removing the previous patch. Do not apply 2 patches to make up for the missed one. If treatment is missed for 3 or more days, contact your healthcare provider for further instructions. What may interact with this medicine?  antihistamines for allergy, cough, and cold  atropine  certain medicines for bladder problems like oxybutynin, tolterodine  certain medicines for Parkinson's disease like benztropine, trihexyphenidyl  certain medicines for stomach problems like dicyclomine, hyoscyamine  glycopyrrolate  ipratropium  certain medicines for travel sickness like scopolamine  medicines that relax your muscles for surgery  other medicines for Alzheimer's disease This list may not describe all possible interactions. Give your health care provider a list of all the medicines, herbs, non-prescription drugs, or dietary supplements you use. Also tell them if you smoke, drink alcohol, or use illegal drugs. Some items may interact with your medicine. What should I watch for while using this medicine? Visit your doctor or health care professional for regular checks on your progress. Check with your doctor or health care professional if your symptoms do not start to get better or if they get worse. You may get drowsy or dizzy. Do not drive, use machinery, or  do anything that needs mental alertness until you know how this drug affects you. Do not stand or sit up quickly, especially if you are an older patient. This reduces the risk of dizzy or fainting spells. Avoid saunas and prolonged exposure to sunlight. You may bathe, swim,  shower, or participate in any of your normal activities while wearing this patch. If the patch falls off, apply a new patch for the rest of the day, then replace the patch the next day at the usual time. If you are going to have a magnetic resonance imaging (MRI) procedure, tell your MRI technician if you have this patch on your body. It must be removed before a MRI. What side effects may I notice from receiving this medicine? Side effects that you should report to your doctor or health care professional as soon as possible:  allergic reactions like skin rash, itching or hives, swelling of the face, lips, or tongue  application site reaction (such as skin redness, blisters, or swelling under or around the patch site)  changes in vision or balance  dizziness  feeling faint or lightheaded, falls  increase in frequency of passing urine or incontinence  nervousness, agitation, or increased confusion  redness, blistering, peeling or loosening of the skin, including inside the mouth  severe diarrhea  slow heartbeat or palpitations  stomach pain  sweating  uncontrollable movements  vomiting  weight loss Side effects that usually do not require medical attention (report to your doctor or health care professional if they continue or are bothersome):  headache  indigestion or heartburn  loss of appetite  mild diarrhea, especially when starting treatment  nausea This list may not describe all possible side effects. Call your doctor for medical advice about side effects. You may report side effects to FDA at 1-800-FDA-1088. Where should I keep my medicine? Keep out of the reach of children. Store at room temperature between 15 and 30 degrees C (59 and 86 degrees F). Store in original pouch until just prior to use. Throw away any unused medicine after the expiration date. Dispose of used patches properly. Since used patches may still contain active medicine, fold the patch in half  so that it sticks to itself prior to disposal. NOTE: This sheet is a summary. It may not cover all possible information. If you have questions about this medicine, talk to your doctor, pharmacist, or health care provider.  2020 Elsevier/Gold Standard (2015-02-02 15:39:13)  Memantine Tablets What is this medicine? MEMANTINE (MEM an teen) is used to treat dementia caused by Alzheimer's disease. This medicine may be used for other purposes; ask your health care provider or pharmacist if you have questions. COMMON BRAND NAME(S): Namenda What should I tell my health care provider before I take this medicine? They need to know if you have any of these conditions:  difficulty passing urine  kidney disease  liver disease  seizures  an unusual or allergic reaction to memantine, other medicines, foods, dyes, or preservatives  pregnant or trying to get pregnant  breast-feeding How should I use this medicine? Take this medicine by mouth with a glass of water. Follow the directions on the prescription label. You may take this medicine with or without food. Take your doses at regular intervals. Do not take your medicine more often than directed. Continue to take your medicine even if you feel better. Do not stop taking except on the advice of your doctor or health care professional. Talk to your pediatrician regarding the  use of this medicine in children. Special care may be needed. Overdosage: If you think you have taken too much of this medicine contact a poison control center or emergency room at once. NOTE: This medicine is only for you. Do not share this medicine with others. What if I miss a dose? If you miss a dose, take it as soon as you can. If it is almost time for your next dose, take only that dose. Do not take double or extra doses. If you do not take your medicine for several days, contact your health care provider. Your dose may need to be changed. What may interact with this  medicine?  acetazolamide  amantadine  cimetidine  dextromethorphan  dofetilide  hydrochlorothiazide  ketamine  metformin  methazolamide  quinidine  ranitidine  sodium bicarbonate  triamterene This list may not describe all possible interactions. Give your health care provider a list of all the medicines, herbs, non-prescription drugs, or dietary supplements you use. Also tell them if you smoke, drink alcohol, or use illegal drugs. Some items may interact with your medicine. What should I watch for while using this medicine? Visit your doctor or health care professional for regular checks on your progress. Check with your doctor or health care professional if there is no improvement in your symptoms or if they get worse. You may get drowsy or dizzy. Do not drive, use machinery, or do anything that needs mental alertness until you know how this drug affects you. Do not stand or sit up quickly, especially if you are an older patient. This reduces the risk of dizzy or fainting spells. Alcohol can make you more drowsy and dizzy. Avoid alcoholic drinks. What side effects may I notice from receiving this medicine? Side effects that you should report to your doctor or health care professional as soon as possible:  allergic reactions like skin rash, itching or hives, swelling of the face, lips, or tongue  agitation or a feeling of restlessness  depressed mood  dizziness  hallucinations  redness, blistering, peeling or loosening of the skin, including inside the mouth  seizures  vomiting Side effects that usually do not require medical attention (report to your doctor or health care professional if they continue or are bothersome):  constipation  diarrhea  headache  nausea  trouble sleeping This list may not describe all possible side effects. Call your doctor for medical advice about side effects. You may report side effects to FDA at 1-800-FDA-1088. Where should I  keep my medicine? Keep out of the reach of children. Store at room temperature between 15 degrees and 30 degrees C (59 degrees and 86 degrees F). Throw away any unused medicine after the expiration date. NOTE: This sheet is a summary. It may not cover all possible information. If you have questions about this medicine, talk to your doctor, pharmacist, or health care provider.  2020 Elsevier/Gold Standard (2013-03-28 14:10:42)

## 2020-05-15 ENCOUNTER — Telehealth: Payer: Self-pay | Admitting: *Deleted

## 2020-05-15 NOTE — Telephone Encounter (Signed)
Received determination on Cover My Meds.   Request Reference Number: [id number]. RIVASTIGMINE DIS 4.6MG /24 is approved through 06/22/2021. Your patient may now fill this prescription and it will be covered.

## 2020-05-15 NOTE — Telephone Encounter (Signed)
Completed Rivastigmine patch PA on Cover My Meds. Key: [id number]. Awaiting determination from [facility].

## 2020-05-15 NOTE — Telephone Encounter (Signed)
Rivastigmine patch PA completed. Awaiting determination from [facility].

## 2020-05-16 ENCOUNTER — Telehealth: Payer: Self-pay | Admitting: Neurology

## 2020-05-16 NOTE — Telephone Encounter (Signed)
Pt's son called wanting to speak to RN about the pt's rivastigmine (EXELON) 4.6 mg/24hr. He wants to know if there is a window with the medication if he is not able to put it exactly at the same time on the pt. Please advise.

## 2020-05-16 NOTE — Telephone Encounter (Signed)
Approximate is fine can be several hours on either end.

## 2020-08-27 DIAGNOSIS — Z1159 Encounter for screening for other viral diseases: Secondary | ICD-10-CM | POA: Diagnosis not present

## 2020-08-27 DIAGNOSIS — Z20828 Contact with and (suspected) exposure to other viral communicable diseases: Secondary | ICD-10-CM | POA: Diagnosis not present

## 2020-09-03 DIAGNOSIS — Z1159 Encounter for screening for other viral diseases: Secondary | ICD-10-CM | POA: Diagnosis not present

## 2020-09-03 DIAGNOSIS — Z20828 Contact with and (suspected) exposure to other viral communicable diseases: Secondary | ICD-10-CM | POA: Diagnosis not present

## 2020-09-10 ENCOUNTER — Telehealth: Payer: Self-pay | Admitting: Neurology

## 2020-09-10 ENCOUNTER — Other Ambulatory Visit: Payer: Self-pay | Admitting: Neurology

## 2020-09-10 DIAGNOSIS — R4189 Other symptoms and signs involving cognitive functions and awareness: Secondary | ICD-10-CM

## 2020-09-10 DIAGNOSIS — F0391 Unspecified dementia with behavioral disturbance: Secondary | ICD-10-CM

## 2020-09-10 DIAGNOSIS — Z20828 Contact with and (suspected) exposure to other viral communicable diseases: Secondary | ICD-10-CM | POA: Diagnosis not present

## 2020-09-10 DIAGNOSIS — Z1159 Encounter for screening for other viral diseases: Secondary | ICD-10-CM | POA: Diagnosis not present

## 2020-09-10 NOTE — Telephone Encounter (Signed)
Candace Baldwin Candace Baldwin: [id number] (exp. 09/10/20 to 10/10/20) order sent to GI. They will reach out to the patient to schedule.

## 2020-09-17 DIAGNOSIS — Z20828 Contact with and (suspected) exposure to other viral communicable diseases: Secondary | ICD-10-CM | POA: Diagnosis not present

## 2020-09-17 DIAGNOSIS — Z1159 Encounter for screening for other viral diseases: Secondary | ICD-10-CM | POA: Diagnosis not present

## 2020-09-19 ENCOUNTER — Other Ambulatory Visit: Payer: Self-pay

## 2020-09-19 ENCOUNTER — Encounter: Payer: Self-pay | Admitting: Neurology

## 2020-09-19 ENCOUNTER — Ambulatory Visit: Payer: Medicare HMO | Admitting: Neurology

## 2020-09-19 DIAGNOSIS — F0281 Dementia in other diseases classified elsewhere with behavioral disturbance: Secondary | ICD-10-CM

## 2020-09-19 DIAGNOSIS — J449 Chronic obstructive pulmonary disease, unspecified: Secondary | ICD-10-CM | POA: Diagnosis not present

## 2020-09-19 DIAGNOSIS — G301 Alzheimer's disease with late onset: Secondary | ICD-10-CM

## 2020-09-19 MED ORDER — RIVASTIGMINE 9.5 MG/24HR TD PT24: 9.5000 mg | MEDICATED_PATCH | Freq: Every day | TRANSDERMAL | 6 refills | Status: AC

## 2020-09-19 NOTE — Progress Notes (Signed)
[facility]    Provider:  Dr Jaynee Eagles Requesting Provider: Vernie Shanks, MD Primary Care Provider:  Vernie Shanks, MD  CC:  Alzheimer's dementia  Patient here today with her son, we had a discussion about [place], she is doing well, she had one mechanical fall but no injuries no imbalance, she is eating well, she gained 4 pounds, she is enjoying herself, she has social interactions, she drinks enough fluids, she is alert today, talkative, we discussed she is doing great we will continue to follow her.  Interval history 05/14/2020: Here for follow up on Alzheimer's dementia diagnosed by formal neurocognitive testing. Here with son and daughter who is on the phone. We discussed the new Aduhelm and the Healthy Brain study the family is interested in discussing Aduhelm with a memory clinic although we did discuss how controversial it is currently in the neurologic community.  She is enjoying [place], we discussed [employer], they are also looking for additional resources to help her during the day, we discussed Alzheimer's again and I answered any questions they may have, we also discussed a memory unit and the children possibly participating in clinical trials, patient is also interested in clinical trials and possibly trying the new IV dementia medication.  I will send to [facility].   Interval history 01/02/2020: Patient is here for follow-up on dementia.  She is here with extended family including family from out of town to discuss.  She was diagnosed by formal neurocognitive testing.  She was diagnosed with major neurocognitive disorder due to probable Alzheimer's disease, without behavioral disturbance.  We discussed the findings which included several areas of impairment including verbal and nonverbal memory, semantic fluency and confrontation naming, and mental flexibility, cognitive deficits are interfering with her ability to manage complex task such as  managing finances, cooking and driving, fitting a diagnostic to treat area for major neurocognitive disorder due to a dementia syndrome, progressively worsening over the last 5 years.  Her cognitive profile is suggestive of medial temporal lobe involvement.  Also moderate atrophy noted her neuroimaging was supportive of this diagnosis.  There also may be depression in diagnosis playing a role.  Recommendations include trying another cholinesterase inhibitor, the new infusion medication for dementia, she may continue to receive assistance with complex ADLs and consider a memory unit at some point, help with medications, POA living and estate planning should be completed, we discussed all of these above including participating in activities, reassessing in a year approximately, I also provided them with multiple resources.  We reviewed power of attorney, healthcare power of attorney, I recommended contacting [facility] or other agency to try to get people to help with her management of medications, we discussed restarting donepezil or memantine but at this time really need a plan for use can help her manage her medications, we discussed memory units, I answered all questions, extended family visit.    HPI:  Candace Baldwin is a 85 y.o. female here as requested by Vernie Shanks, MD for cognitive decline. PMHx adjustment d/o with anxiety and depression, osteoarthritis.  I reviewed Dr. Jodi Mourning notes, son says she has been diagnosed with cognitive impairment, recently complaining of being "foggy headed ", she thinks it might be her meds her citalopram and maybe even the lisinopril, diagnosed with age-related cognitive decline, she is still able to be at home, she can bathe and feed herself as long she does not have to cook, son is looking into home  care retirement communities, she was placed on the Aricept and she did have some side effects nausea and vomiting and the Aricept was then ordered half tab and the  citalopram was decreased to 10 mg.  At last appointment patient said that she was digressing backwards rapidly, only occasional social activity, she has a PhD from [employer].  Last lab work in September 2020 which was unremarkable CBC, CMP with BUN 27 and creatinine 0.81, B12 was 312, TSH 1.04, vitamin D 46.4.  She has a PhD from [employer]. She says 2 years ago she started forgetting things, forgetting where her purse was for example. She bought a pad and started writing things down and after that she eliminated the problem, she started putting things in a certain place. She said this was frightening when it happened. 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 Now if she is tired it can be worse. She feels she kinda stopped it but she is getting tired of writing things down. She lives with her dog, she has a town home, her flowers are lovely and she has a shade garden. She says she pays her own bills She has forgotten to pay bills. Her son is concerned about her memory.  Her son's girlfriend offered to help and is now paying the bills.She is not taking anything daily as far as meds go. She states she never misses appointments. She declines her son coming in with her and I suspect history is lacking and there are more deficits than patient is telling me.She denies dementia in the family. Son says its more, confusing, agitation, more memory loss than stated.   Reviewed notes, labs and imaging from outside physicians, which showed: I reviewed images from 2005 CT head and agree with the following:  1. Negative for intracranial process or bleed.  2. Mild atrophy with nonspecific white matter changes in the right frontal lobe.   3. Minimal sphenoid sinus disease.        Review of Systems: Patient complains of symptoms per HPI as well as the following symptoms: one fall . Pertinent negatives and positives per HPI. All others negative    Social History   Socioeconomic History  . Marital status:  Divorced    Spouse name: Not on file  . Number of children: 2  . Years of education: Not on file  . Highest education level: Doctorate  Occupational History  . Occupation: Retired    Comment: Advertising account planner from [employer]  . Smoking status: Never Smoker  . Smokeless tobacco: Never Used  Substance and Sexual Activity  . Alcohol use: Yes    Comment: rarely wine   . Drug use: Never  . Sexual activity: Not on file  Other Topics Concern  . Not on file  Social History Narrative   LIves at [employer] in independent living    Caffeine: iced tea maybe 2 per day, soda occasionally    Social Determinants of Health   Financial Resource Strain: Not on file  Food Insecurity: Not on file  Transportation Needs: Not on file  Physical Activity: Not on file  Stress: Not on file  Social Connections: Not on file  Intimate Partner Violence: Not on file    Family History  Problem Relation Age of Onset  . Heart failure Father   . Other Father        ?heart attack  . Rheum arthritis Paternal Grandmother        Aunt x 2  .  Breast cancer Mother   . Cancer Sister   . Arthritis Sister   . Diabetes Brother   . Dementia Neg Hx     Past Medical History:  Diagnosis Date  . Allergic rhinitis   . ALLERGIC RHINITIS 10/29/2007   Qualifier: Diagnosis of  By: Jenny Reichmann MD, Hunt Oris   . BCC (basal cell carcinoma) 09/01/1978   Right upper chest (Dr Radford Pax)  . BCC (basal cell carcinoma) 07/23/1981   Left Neck (Dr. Radford Pax)  . BCC (basal cell carcinoma) 03/17/1987   neck (Dr. Radford Pax)  . BCC (basal cell carcinoma) 08/14/1989   Left malar area, left clavicle (Dr. Radford Pax)  . BCC (basal cell carcinoma) 07/22/1985   Left neck, Left nose (Dr. Radford Pax)  . BCC (basal cell carcinoma) 04/27/1990   left upperarm  . BCC (basal cell carcinoma) 05/11/1991   right chest (Dr. Radford Pax)  . BCC (basal cell carcinoma) 02/02/2008   above right upper lip (MOHS), Behind Left ear (CX35FU)  . BCC (basal cell carcinoma)  05/13/2017   Right neck  . Chronic hoarseness 06/04/2011   Onset winter of 2011-2012   . DEGENERATIVE JOINT DISEASE, HANDS 01/23/2007   Qualifier: Diagnosis of  By: Jenny Reichmann MD, Hunt Oris   . Diverticulosis   . DIVERTICULOSIS, COLON 01/23/2007   Qualifier: Diagnosis of  By: Jenny Reichmann MD, Hunt Oris   . DJD (degenerative joint disease)   . Heart murmur   . Hyperlipidemia   . HYPERLIPIDEMIA 01/23/2007   Qualifier: Diagnosis of  By: Jenny Reichmann MD, Hunt Oris   . Osteoporosis   . OSTEOPOROSIS 10/29/2007   Qualifier: Diagnosis of  By: Jenny Reichmann MD, Hunt Oris   . Shingles   . SKIN CANCER, HX OF 01/23/2007   Qualifier: Diagnosis of  By: Jenny Reichmann MD, Hunt Oris     Patient Active Problem List   Diagnosis Date Noted  . Late onset Alzheimer's disease with behavioral disturbance ([place]) 01/03/2020  . Major neurocognitive disorder ([place]) 09/13/2019  . Diverticulitis 05/29/2015  . Dehydration 05/29/2015  . Nausea and vomiting 05/29/2015  . Superior mesenteric artery stenosis ([place]) 05/29/2015  . Hyperglycemia 05/29/2015  . Acute exacerbation of COPD with asthma ([place]) 08/06/2012  . Preventative health care 09/06/2011  . Chronic hoarseness 06/04/2011  . SHINGLES 04/12/2008  . ALLERGIC RHINITIS 10/29/2007  . OSTEOPOROSIS 10/29/2007  . HYPERLIPIDEMIA 01/23/2007  . DIVERTICULOSIS, COLON 01/23/2007  . DEGENERATIVE JOINT DISEASE, HANDS 01/23/2007  . SKIN CANCER, HX OF 01/23/2007  . CARDIAC MURMUR, HX OF 01/23/2007    Past Surgical History:  Procedure Laterality Date  . CATARACT EXTRACTION, BILATERAL    . FOOT SURGERY  2011   Bunion with prolonged recovery  . MOHS SURGERY     Nose  . VAGINAL HYSTERECTOMY  1990    Current Outpatient Medications  Medication Sig Dispense Refill  . rivastigmine (EXELON) 9.5 mg/24hr Place 1 patch (9.5 mg total) onto the skin daily. 90 patch 6   No current facility-administered medications for this visit.    Allergies as of 09/19/2020 - Review Complete 09/19/2020  Allergen Reaction Noted  .  Lipitor [atorvastatin]  09/09/2010  . Montelukast sodium    . Penicillins  01/23/2007  . Simvastatin  09/09/2010  . Sulfonamide derivatives  01/23/2007    Vitals: BP (!) 146/84 (BP Location: Right Arm, Patient Position: Sitting)   Pulse 70   Ht 5\' 5"  (1.651 m)   Wt 104 lb (47.2 kg)   BMI 17.31 kg/m  Last Weight:  Wt Readings from Last 1  Encounters:  09/19/20 104 lb (47.2 kg)   Last Height:   Ht Readings from Last 1 Encounters:  09/19/20 5\' 5"  (1.651 m)     Physical exam: stable Exam: Gen: NAD, conversant,Tangential, thin, well groomed                     CV: RRR, no MRG. No Carotid Bruits. No peripheral edema, warm, nontender Eyes: Conjunctivae clear without exudates or hemorrhage  Neuro: Detailed Neurologic Exam  Speech:    Speech is normal; fluent and spontaneous with normal comprehension.  Cognition:     MMSE - Mini Mental State Exam 09/13/2019  Orientation to time 4  Orientation to Place 2  Registration 3  Attention/ Calculation 1  Recall 0  Language- name 2 objects 2  Language- repeat 1  Language- follow 3 step command 3  Language- read & follow direction 1  Write a sentence 1  Copy design 0  Total score 18    Cranial Nerves:    The pupils are equal, round, and reactive to light. Attempted fundoscopy could not visualize due to amall puils.  Visual fields are full to finger confrontation. Extraocular movements are intact. Trigeminal sensation is intact and the muscles of mastication are normal. The face is symmetric. The palate elevates in the midline. Hearing intact. Voice is normal. Shoulder shrug is normal. The tongue has normal motion without fasciculations.   Coordination:    Normal finger to nose  Gait:    Normal native gait  Motor Observation:    No asymmetry, no atrophy, and no involuntary movements noted. Tone:    Normal muscle tone.    Posture:    Posture is normal. normal erect    Strength:    Strength is V/V in the upper and lower  limbs.      Sensation: intact to LT     Reflex Exam:  DTR's: Absent AJs otherwise deep tendon reflexes in the upper and lower extremities are normal bilaterally.   Toes:    The toes are downgoing bilaterally.   Clonus:    Clonus is absent.    Assessment/Plan: Really lovely 85 year old with no significant past medical history, highly educated she has a PhD from [facility], with diagnosed Alzheimer's dementia  -She did not tolerate Aricept, will increase rivastigmine patch.  We can slowly titrate and then consider Namenda -Patient has a PhD from [facility], she is very motivated to participate in clinical trials, she would also consider treatment with the new biogen Aduhelm.  I will refer her to [facility] -her children are also interested in the healthy brain study, when I refer patient there they can ask about how they participate in this trial. - doing well at [facility].  - They would like referral to [facility] for all of the above, happy to refer and continue to co-manage with [facility]. They decided not to go,  Meds ordered this encounter  Medications  . rivastigmine (EXELON) 9.5 mg/24hr    Sig: Place 1 patch (9.5 mg total) onto the skin daily.    Dispense:  90 patch    Refill:  6     Prior visit Extended family meeting with son and daughter, daughter drove from [place] to be here today, recently diagnosed with Alzheimer's through formal memory testing, we discussed dementia, power of attorney, healthcare power of attorney, given the multiple resources for having people help her, discussed memory units, we discussed findings on formal memory testing, we reviewed MRI of  the brain which showed generalized cortical atrophy most pronounced in the medial temporal lobes which is consistent with Alzheimer's but she also has extensive chronic microvascular ischemic changes which can also cause memory loss, I gave him literature to read about these particular conditions, I discussed diet, also answered  daughter's questions about hereditary risk for Alzheimer's and ways we can help prevent dementia, provided them with a book "the XX brain", discussed the M IND diet out of rash, answered all questions, extended 120-minute appointment.  No orders of the defined types were placed in this encounter.   Cc: Vernie Shanks, MD   I spent over 60 minutes of face-to-face and non-face-to-face time with patient on the  1. Late onset Alzheimer's dementia with behavioral disturbance ([place])    diagnosis.  This included previsit chart review, lab review, study review, order entry, electronic health record documentation, patient education on the different diagnostic and therapeutic options, counseling and coordination of care, risks and benefits of management, compliance, or risk factor reduction   Sarina Ill, MD  [facility] [address] [place], [place] [address]  Phone [phone] Fax [phone]  I spent over 30 minutes of face-to-face and non-face-to-face time with patient on the  1. Late onset Alzheimer's dementia with behavioral disturbance ([place])    diagnosis.  This included previsit chart review, lab review, study review, order entry, electronic health record documentation, patient education on the different diagnostic and therapeutic options, counseling and coordination of care, risks and benefits of management, compliance, or risk factor reduction

## 2020-09-19 NOTE — Patient Instructions (Signed)
  Increase Rivastigmine patch

## 2020-09-24 DIAGNOSIS — Z20828 Contact with and (suspected) exposure to other viral communicable diseases: Secondary | ICD-10-CM | POA: Diagnosis not present

## 2020-09-24 DIAGNOSIS — Z1159 Encounter for screening for other viral diseases: Secondary | ICD-10-CM | POA: Diagnosis not present

## 2020-10-01 DIAGNOSIS — Z1159 Encounter for screening for other viral diseases: Secondary | ICD-10-CM | POA: Diagnosis not present

## 2020-10-01 DIAGNOSIS — Z20828 Contact with and (suspected) exposure to other viral communicable diseases: Secondary | ICD-10-CM | POA: Diagnosis not present

## 2020-10-02 ENCOUNTER — Encounter: Payer: Medicare HMO | Admitting: Psychology

## 2020-10-08 DIAGNOSIS — Z20828 Contact with and (suspected) exposure to other viral communicable diseases: Secondary | ICD-10-CM | POA: Diagnosis not present

## 2020-10-08 DIAGNOSIS — Z1159 Encounter for screening for other viral diseases: Secondary | ICD-10-CM | POA: Diagnosis not present

## 2020-10-22 DIAGNOSIS — Z20828 Contact with and (suspected) exposure to other viral communicable diseases: Secondary | ICD-10-CM | POA: Diagnosis not present

## 2020-10-22 DIAGNOSIS — Z1159 Encounter for screening for other viral diseases: Secondary | ICD-10-CM | POA: Diagnosis not present

## 2020-10-29 DIAGNOSIS — Z1159 Encounter for screening for other viral diseases: Secondary | ICD-10-CM | POA: Diagnosis not present

## 2020-10-29 DIAGNOSIS — Z20828 Contact with and (suspected) exposure to other viral communicable diseases: Secondary | ICD-10-CM | POA: Diagnosis not present

## 2020-11-05 DIAGNOSIS — Z20828 Contact with and (suspected) exposure to other viral communicable diseases: Secondary | ICD-10-CM | POA: Diagnosis not present

## 2020-11-05 DIAGNOSIS — Z1159 Encounter for screening for other viral diseases: Secondary | ICD-10-CM | POA: Diagnosis not present

## 2020-11-12 DIAGNOSIS — Z20828 Contact with and (suspected) exposure to other viral communicable diseases: Secondary | ICD-10-CM | POA: Diagnosis not present

## 2020-11-12 DIAGNOSIS — Z1159 Encounter for screening for other viral diseases: Secondary | ICD-10-CM | POA: Diagnosis not present

## 2020-11-19 DIAGNOSIS — Z20828 Contact with and (suspected) exposure to other viral communicable diseases: Secondary | ICD-10-CM | POA: Diagnosis not present

## 2020-11-19 DIAGNOSIS — Z1159 Encounter for screening for other viral diseases: Secondary | ICD-10-CM | POA: Diagnosis not present

## 2020-11-26 DIAGNOSIS — Z1159 Encounter for screening for other viral diseases: Secondary | ICD-10-CM | POA: Diagnosis not present

## 2020-11-26 DIAGNOSIS — Z20828 Contact with and (suspected) exposure to other viral communicable diseases: Secondary | ICD-10-CM | POA: Diagnosis not present

## 2020-12-10 DIAGNOSIS — Z1159 Encounter for screening for other viral diseases: Secondary | ICD-10-CM | POA: Diagnosis not present

## 2020-12-10 DIAGNOSIS — Z20828 Contact with and (suspected) exposure to other viral communicable diseases: Secondary | ICD-10-CM | POA: Diagnosis not present

## 2020-12-17 DIAGNOSIS — Z1159 Encounter for screening for other viral diseases: Secondary | ICD-10-CM | POA: Diagnosis not present

## 2020-12-17 DIAGNOSIS — Z20828 Contact with and (suspected) exposure to other viral communicable diseases: Secondary | ICD-10-CM | POA: Diagnosis not present

## 2020-12-24 DIAGNOSIS — Z20828 Contact with and (suspected) exposure to other viral communicable diseases: Secondary | ICD-10-CM | POA: Diagnosis not present

## 2020-12-24 DIAGNOSIS — Z1159 Encounter for screening for other viral diseases: Secondary | ICD-10-CM | POA: Diagnosis not present

## 2020-12-31 DIAGNOSIS — F4323 Adjustment disorder with mixed anxiety and depressed mood: Secondary | ICD-10-CM | POA: Diagnosis not present

## 2020-12-31 DIAGNOSIS — R109 Unspecified abdominal pain: Secondary | ICD-10-CM | POA: Diagnosis not present

## 2020-12-31 DIAGNOSIS — Z20828 Contact with and (suspected) exposure to other viral communicable diseases: Secondary | ICD-10-CM | POA: Diagnosis not present

## 2020-12-31 DIAGNOSIS — Z1159 Encounter for screening for other viral diseases: Secondary | ICD-10-CM | POA: Diagnosis not present

## 2021-01-07 DIAGNOSIS — Z20828 Contact with and (suspected) exposure to other viral communicable diseases: Secondary | ICD-10-CM | POA: Diagnosis not present

## 2021-01-07 DIAGNOSIS — Z1159 Encounter for screening for other viral diseases: Secondary | ICD-10-CM | POA: Diagnosis not present

## 2021-01-14 DIAGNOSIS — Z1159 Encounter for screening for other viral diseases: Secondary | ICD-10-CM | POA: Diagnosis not present

## 2021-01-14 DIAGNOSIS — Z20828 Contact with and (suspected) exposure to other viral communicable diseases: Secondary | ICD-10-CM | POA: Diagnosis not present

## 2021-01-28 DIAGNOSIS — Z20828 Contact with and (suspected) exposure to other viral communicable diseases: Secondary | ICD-10-CM | POA: Diagnosis not present

## 2021-02-04 DIAGNOSIS — Z8616 Personal history of COVID-19: Secondary | ICD-10-CM | POA: Diagnosis not present

## 2021-02-19 DIAGNOSIS — Z09 Encounter for follow-up examination after completed treatment for conditions other than malignant neoplasm: Secondary | ICD-10-CM | POA: Diagnosis not present

## 2021-02-19 DIAGNOSIS — N390 Urinary tract infection, site not specified: Secondary | ICD-10-CM | POA: Diagnosis not present

## 2021-02-19 DIAGNOSIS — L03032 Cellulitis of left toe: Secondary | ICD-10-CM | POA: Diagnosis not present

## 2021-02-20 DIAGNOSIS — Z20828 Contact with and (suspected) exposure to other viral communicable diseases: Secondary | ICD-10-CM | POA: Diagnosis not present

## 2021-02-27 DIAGNOSIS — Z20828 Contact with and (suspected) exposure to other viral communicable diseases: Secondary | ICD-10-CM | POA: Diagnosis not present

## 2021-03-06 DIAGNOSIS — Z20828 Contact with and (suspected) exposure to other viral communicable diseases: Secondary | ICD-10-CM | POA: Diagnosis not present

## 2021-03-13 DIAGNOSIS — Z20828 Contact with and (suspected) exposure to other viral communicable diseases: Secondary | ICD-10-CM | POA: Diagnosis not present

## 2021-03-20 DIAGNOSIS — Z20828 Contact with and (suspected) exposure to other viral communicable diseases: Secondary | ICD-10-CM | POA: Diagnosis not present

## 2021-03-27 DIAGNOSIS — Z8616 Personal history of COVID-19: Secondary | ICD-10-CM | POA: Diagnosis not present

## 2021-04-03 DIAGNOSIS — Z20828 Contact with and (suspected) exposure to other viral communicable diseases: Secondary | ICD-10-CM | POA: Diagnosis not present

## 2021-04-10 DIAGNOSIS — Z20828 Contact with and (suspected) exposure to other viral communicable diseases: Secondary | ICD-10-CM | POA: Diagnosis not present

## 2021-04-24 DIAGNOSIS — Z20828 Contact with and (suspected) exposure to other viral communicable diseases: Secondary | ICD-10-CM | POA: Diagnosis not present

## 2021-05-01 DIAGNOSIS — Z20822 Contact with and (suspected) exposure to covid-19: Secondary | ICD-10-CM | POA: Diagnosis not present

## 2021-05-08 DIAGNOSIS — Z20828 Contact with and (suspected) exposure to other viral communicable diseases: Secondary | ICD-10-CM | POA: Diagnosis not present

## 2021-05-15 DIAGNOSIS — Z20828 Contact with and (suspected) exposure to other viral communicable diseases: Secondary | ICD-10-CM | POA: Diagnosis not present

## 2021-05-20 DIAGNOSIS — Z20828 Contact with and (suspected) exposure to other viral communicable diseases: Secondary | ICD-10-CM | POA: Diagnosis not present

## 2021-05-20 DIAGNOSIS — Z1159 Encounter for screening for other viral diseases: Secondary | ICD-10-CM | POA: Diagnosis not present

## 2021-05-27 DIAGNOSIS — Z20828 Contact with and (suspected) exposure to other viral communicable diseases: Secondary | ICD-10-CM | POA: Diagnosis not present

## 2021-05-27 DIAGNOSIS — Z1159 Encounter for screening for other viral diseases: Secondary | ICD-10-CM | POA: Diagnosis not present

## 2021-06-03 DIAGNOSIS — Z20828 Contact with and (suspected) exposure to other viral communicable diseases: Secondary | ICD-10-CM | POA: Diagnosis not present

## 2021-06-03 DIAGNOSIS — Z1159 Encounter for screening for other viral diseases: Secondary | ICD-10-CM | POA: Diagnosis not present

## 2021-06-05 DIAGNOSIS — Z20828 Contact with and (suspected) exposure to other viral communicable diseases: Secondary | ICD-10-CM | POA: Diagnosis not present

## 2021-06-05 DIAGNOSIS — Z1159 Encounter for screening for other viral diseases: Secondary | ICD-10-CM | POA: Diagnosis not present

## 2021-06-07 DIAGNOSIS — Z1159 Encounter for screening for other viral diseases: Secondary | ICD-10-CM | POA: Diagnosis not present

## 2021-06-07 DIAGNOSIS — Z20828 Contact with and (suspected) exposure to other viral communicable diseases: Secondary | ICD-10-CM | POA: Diagnosis not present

## 2021-06-24 DIAGNOSIS — Z20822 Contact with and (suspected) exposure to covid-19: Secondary | ICD-10-CM | POA: Diagnosis not present

## 2021-07-01 DIAGNOSIS — M1909 Primary osteoarthritis, other specified site: Secondary | ICD-10-CM | POA: Diagnosis not present

## 2021-07-01 DIAGNOSIS — Z79899 Other long term (current) drug therapy: Secondary | ICD-10-CM | POA: Diagnosis not present

## 2021-07-01 DIAGNOSIS — Z20822 Contact with and (suspected) exposure to covid-19: Secondary | ICD-10-CM | POA: Diagnosis not present

## 2021-07-01 DIAGNOSIS — G3184 Mild cognitive impairment, so stated: Secondary | ICD-10-CM | POA: Diagnosis not present

## 2021-07-01 DIAGNOSIS — F02A0 Dementia in other diseases classified elsewhere, mild, without behavioral disturbance, psychotic disturbance, mood disturbance, and anxiety: Secondary | ICD-10-CM | POA: Diagnosis not present

## 2021-07-01 DIAGNOSIS — F331 Major depressive disorder, recurrent, moderate: Secondary | ICD-10-CM | POA: Diagnosis not present

## 2021-09-02 DIAGNOSIS — M2041 Other hammer toe(s) (acquired), right foot: Secondary | ICD-10-CM | POA: Diagnosis not present

## 2021-09-02 DIAGNOSIS — B351 Tinea unguium: Secondary | ICD-10-CM | POA: Diagnosis not present

## 2021-09-03 DIAGNOSIS — Z20828 Contact with and (suspected) exposure to other viral communicable diseases: Secondary | ICD-10-CM | POA: Diagnosis not present

## 2021-09-04 DIAGNOSIS — L603 Nail dystrophy: Secondary | ICD-10-CM | POA: Diagnosis not present

## 2021-09-04 DIAGNOSIS — E782 Mixed hyperlipidemia: Secondary | ICD-10-CM | POA: Diagnosis not present

## 2021-09-04 DIAGNOSIS — G301 Alzheimer's disease with late onset: Secondary | ICD-10-CM | POA: Diagnosis not present

## 2021-09-04 DIAGNOSIS — I739 Peripheral vascular disease, unspecified: Secondary | ICD-10-CM | POA: Diagnosis not present

## 2021-09-25 ENCOUNTER — Ambulatory Visit: Payer: Medicare HMO | Admitting: Neurology

## 2021-10-18 DIAGNOSIS — I10 Essential (primary) hypertension: Secondary | ICD-10-CM | POA: Diagnosis not present

## 2021-10-18 DIAGNOSIS — E785 Hyperlipidemia, unspecified: Secondary | ICD-10-CM | POA: Diagnosis not present

## 2021-11-01 DIAGNOSIS — G301 Alzheimer's disease with late onset: Secondary | ICD-10-CM | POA: Diagnosis not present

## 2021-11-01 DIAGNOSIS — F02B18 Dementia in other diseases classified elsewhere, moderate, with other behavioral disturbance: Secondary | ICD-10-CM | POA: Diagnosis not present

## 2021-11-07 DIAGNOSIS — F339 Major depressive disorder, recurrent, unspecified: Secondary | ICD-10-CM | POA: Diagnosis not present

## 2021-11-07 DIAGNOSIS — E46 Unspecified protein-calorie malnutrition: Secondary | ICD-10-CM | POA: Diagnosis not present

## 2021-12-19 DIAGNOSIS — B351 Tinea unguium: Secondary | ICD-10-CM | POA: Diagnosis not present

## 2021-12-19 DIAGNOSIS — M2041 Other hammer toe(s) (acquired), right foot: Secondary | ICD-10-CM | POA: Diagnosis not present

## 2021-12-19 DIAGNOSIS — F339 Major depressive disorder, recurrent, unspecified: Secondary | ICD-10-CM | POA: Diagnosis not present

## 2021-12-19 DIAGNOSIS — E46 Unspecified protein-calorie malnutrition: Secondary | ICD-10-CM | POA: Diagnosis not present

## 2021-12-19 DIAGNOSIS — L84 Corns and callosities: Secondary | ICD-10-CM | POA: Diagnosis not present

## 2022-02-20 DIAGNOSIS — M1991 Primary osteoarthritis, unspecified site: Secondary | ICD-10-CM | POA: Diagnosis not present

## 2022-02-20 DIAGNOSIS — E46 Unspecified protein-calorie malnutrition: Secondary | ICD-10-CM | POA: Diagnosis not present

## 2023-09-22 DEATH — deceased
